# Patient Record
Sex: Female | Born: 1960
Health system: Southern US, Community
[De-identification: ages and names within clinical notes are randomized; demographics above are authoritative.]

## PROBLEM LIST (undated history)

## (undated) DIAGNOSIS — N7011 Chronic salpingitis: Secondary | ICD-10-CM

## (undated) DIAGNOSIS — I10 Essential (primary) hypertension: Secondary | ICD-10-CM

## (undated) DIAGNOSIS — N83209 Unspecified ovarian cyst, unspecified side: Secondary | ICD-10-CM

## (undated) DIAGNOSIS — I4891 Unspecified atrial fibrillation: Secondary | ICD-10-CM

## (undated) DIAGNOSIS — E7800 Pure hypercholesterolemia, unspecified: Secondary | ICD-10-CM

## (undated) DIAGNOSIS — G43909 Migraine, unspecified, not intractable, without status migrainosus: Secondary | ICD-10-CM

## (undated) DIAGNOSIS — D219 Benign neoplasm of connective and other soft tissue, unspecified: Secondary | ICD-10-CM

## (undated) DIAGNOSIS — G4730 Sleep apnea, unspecified: Secondary | ICD-10-CM

## (undated) DIAGNOSIS — R079 Chest pain, unspecified: Secondary | ICD-10-CM

## (undated) DIAGNOSIS — K219 Gastro-esophageal reflux disease without esophagitis: Secondary | ICD-10-CM

## (undated) HISTORY — DX: Unspecified atrial fibrillation: I48.91

## (undated) HISTORY — PX: OOPHORECTOMY: SHX86

## (undated) HISTORY — PX: OTHER SURGICAL HISTORY: SHX169

## (undated) HISTORY — PX: GANGLION CYST EXCISION: SHX1691

## (undated) HISTORY — PX: KNEE SURGERY: SHX244

## (undated) HISTORY — DX: Benign neoplasm of connective and other soft tissue, unspecified: D21.9

## (undated) HISTORY — DX: Essential (primary) hypertension: I10

## (undated) HISTORY — DX: Chronic salpingitis: N70.11

## (undated) HISTORY — DX: Gastro-esophageal reflux disease without esophagitis: K21.9

## (undated) HISTORY — DX: Chest pain, unspecified: R07.9

## (undated) HISTORY — DX: Migraine, unspecified, not intractable, without status migrainosus: G43.909

## (undated) HISTORY — DX: Sleep apnea, unspecified: G47.30

## (undated) HISTORY — DX: Unspecified ovarian cyst, unspecified side: N83.209

## (undated) HISTORY — PX: MYOMECTOMY: SHX85

## (undated) HISTORY — PX: ABDOMINAL SURGERY: SHX537

## (undated) HISTORY — DX: Pure hypercholesterolemia, unspecified: E78.00

## (undated) HISTORY — PX: SALPINGECTOMY: SHX328

## (undated) HISTORY — PX: KENALOG INJECTION: SHX5298

---
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
## 1990-11-25 HISTORY — PX: VAGINAL HYSTERECTOMY: SUR661

## 1998-07-13 ENCOUNTER — Ambulatory Visit (HOSPITAL_COMMUNITY): Admission: RE | Admit: 1998-07-13 | Discharge: 1998-07-13 | Payer: Self-pay | Admitting: Obstetrics and Gynecology

## 1998-07-24 ENCOUNTER — Inpatient Hospital Stay (HOSPITAL_COMMUNITY): Admission: RE | Admit: 1998-07-24 | Discharge: 1998-07-27 | Payer: Self-pay | Admitting: Obstetrics and Gynecology

## 1999-03-23 ENCOUNTER — Emergency Department (HOSPITAL_COMMUNITY): Admission: EM | Admit: 1999-03-23 | Discharge: 1999-03-23 | Payer: Self-pay | Admitting: Emergency Medicine

## 1999-03-23 ENCOUNTER — Encounter: Payer: Self-pay | Admitting: Emergency Medicine

## 1999-03-30 ENCOUNTER — Ambulatory Visit (HOSPITAL_COMMUNITY): Admission: RE | Admit: 1999-03-30 | Discharge: 1999-03-30 | Payer: Self-pay

## 1999-05-07 ENCOUNTER — Other Ambulatory Visit: Admission: RE | Admit: 1999-05-07 | Discharge: 1999-05-07 | Payer: Self-pay | Admitting: Obstetrics and Gynecology

## 1999-09-20 ENCOUNTER — Encounter: Admission: RE | Admit: 1999-09-20 | Discharge: 1999-09-20 | Payer: Self-pay | Admitting: Family Medicine

## 1999-09-20 ENCOUNTER — Encounter: Payer: Self-pay | Admitting: Family Medicine

## 2000-05-07 ENCOUNTER — Ambulatory Visit (HOSPITAL_COMMUNITY): Admission: RE | Admit: 2000-05-07 | Discharge: 2000-05-07 | Payer: Self-pay | Admitting: Obstetrics and Gynecology

## 2000-05-07 ENCOUNTER — Encounter: Payer: Self-pay | Admitting: Obstetrics and Gynecology

## 2000-05-16 ENCOUNTER — Encounter: Payer: Self-pay | Admitting: Obstetrics and Gynecology

## 2000-05-16 ENCOUNTER — Encounter: Admission: RE | Admit: 2000-05-16 | Discharge: 2000-05-16 | Payer: Self-pay | Admitting: Obstetrics and Gynecology

## 2000-05-21 ENCOUNTER — Other Ambulatory Visit: Admission: RE | Admit: 2000-05-21 | Discharge: 2000-05-21 | Payer: Self-pay | Admitting: Obstetrics and Gynecology

## 2001-02-10 ENCOUNTER — Encounter: Payer: Self-pay | Admitting: Gastroenterology

## 2001-02-10 ENCOUNTER — Encounter: Admission: RE | Admit: 2001-02-10 | Discharge: 2001-02-10 | Payer: Self-pay | Admitting: Gastroenterology

## 2001-05-15 ENCOUNTER — Encounter: Payer: Self-pay | Admitting: Obstetrics and Gynecology

## 2001-05-15 ENCOUNTER — Ambulatory Visit (HOSPITAL_COMMUNITY): Admission: RE | Admit: 2001-05-15 | Discharge: 2001-05-15 | Payer: Self-pay | Admitting: Obstetrics and Gynecology

## 2001-05-20 ENCOUNTER — Encounter: Payer: Self-pay | Admitting: Obstetrics and Gynecology

## 2001-05-20 ENCOUNTER — Encounter: Admission: RE | Admit: 2001-05-20 | Discharge: 2001-05-20 | Payer: Self-pay | Admitting: Obstetrics and Gynecology

## 2001-05-21 ENCOUNTER — Other Ambulatory Visit: Admission: RE | Admit: 2001-05-21 | Discharge: 2001-05-21 | Payer: Self-pay | Admitting: Obstetrics and Gynecology

## 2001-06-09 ENCOUNTER — Encounter: Payer: Self-pay | Admitting: Family Medicine

## 2001-06-09 ENCOUNTER — Encounter: Admission: RE | Admit: 2001-06-09 | Discharge: 2001-06-09 | Payer: Self-pay | Admitting: Family Medicine

## 2002-12-31 ENCOUNTER — Encounter: Payer: Self-pay | Admitting: Obstetrics and Gynecology

## 2002-12-31 ENCOUNTER — Encounter: Admission: RE | Admit: 2002-12-31 | Discharge: 2002-12-31 | Payer: Self-pay | Admitting: Obstetrics and Gynecology

## 2003-01-06 ENCOUNTER — Other Ambulatory Visit: Admission: RE | Admit: 2003-01-06 | Discharge: 2003-01-06 | Payer: Self-pay | Admitting: Obstetrics and Gynecology

## 2003-02-16 ENCOUNTER — Encounter: Admission: RE | Admit: 2003-02-16 | Discharge: 2003-02-16 | Payer: Self-pay | Admitting: Family Medicine

## 2003-02-16 ENCOUNTER — Encounter: Payer: Self-pay | Admitting: Family Medicine

## 2003-08-17 ENCOUNTER — Encounter: Payer: Self-pay | Admitting: Family Medicine

## 2003-08-17 ENCOUNTER — Ambulatory Visit (HOSPITAL_COMMUNITY): Admission: RE | Admit: 2003-08-17 | Discharge: 2003-08-17 | Payer: Self-pay | Admitting: Family Medicine

## 2004-01-19 ENCOUNTER — Encounter: Admission: RE | Admit: 2004-01-19 | Discharge: 2004-01-19 | Payer: Self-pay | Admitting: Obstetrics and Gynecology

## 2004-01-25 ENCOUNTER — Ambulatory Visit (HOSPITAL_COMMUNITY): Admission: RE | Admit: 2004-01-25 | Discharge: 2004-01-25 | Payer: Self-pay | Admitting: Family Medicine

## 2004-01-26 ENCOUNTER — Other Ambulatory Visit: Admission: RE | Admit: 2004-01-26 | Discharge: 2004-01-26 | Payer: Self-pay | Admitting: Obstetrics and Gynecology

## 2004-10-30 ENCOUNTER — Ambulatory Visit (HOSPITAL_COMMUNITY): Admission: RE | Admit: 2004-10-30 | Discharge: 2004-10-30 | Payer: Self-pay | Admitting: Gastroenterology

## 2005-09-10 ENCOUNTER — Encounter: Admission: RE | Admit: 2005-09-10 | Discharge: 2005-09-10 | Payer: Self-pay | Admitting: Family Medicine

## 2005-09-17 ENCOUNTER — Other Ambulatory Visit: Admission: RE | Admit: 2005-09-17 | Discharge: 2005-09-17 | Payer: Self-pay | Admitting: Obstetrics and Gynecology

## 2006-05-02 ENCOUNTER — Encounter: Admission: RE | Admit: 2006-05-02 | Discharge: 2006-05-02 | Payer: Self-pay | Admitting: Family Medicine

## 2006-05-07 ENCOUNTER — Encounter: Admission: RE | Admit: 2006-05-07 | Discharge: 2006-05-07 | Payer: Self-pay | Admitting: Family Medicine

## 2007-01-13 ENCOUNTER — Emergency Department (HOSPITAL_COMMUNITY): Admission: EM | Admit: 2007-01-13 | Discharge: 2007-01-13 | Payer: Self-pay | Admitting: Emergency Medicine

## 2007-01-20 ENCOUNTER — Other Ambulatory Visit: Admission: RE | Admit: 2007-01-20 | Discharge: 2007-01-20 | Payer: Self-pay | Admitting: Obstetrics and Gynecology

## 2007-01-21 ENCOUNTER — Encounter: Admission: RE | Admit: 2007-01-21 | Discharge: 2007-01-21 | Payer: Self-pay | Admitting: Obstetrics and Gynecology

## 2007-11-12 ENCOUNTER — Encounter: Admission: RE | Admit: 2007-11-12 | Discharge: 2007-11-13 | Payer: Self-pay | Admitting: Family Medicine

## 2008-03-17 ENCOUNTER — Encounter: Admission: RE | Admit: 2008-03-17 | Discharge: 2008-03-17 | Payer: Self-pay | Admitting: Obstetrics and Gynecology

## 2008-04-15 ENCOUNTER — Other Ambulatory Visit: Admission: RE | Admit: 2008-04-15 | Discharge: 2008-04-15 | Payer: Self-pay | Admitting: Obstetrics and Gynecology

## 2009-03-13 ENCOUNTER — Encounter
Admission: RE | Admit: 2009-03-13 | Discharge: 2009-03-13 | Payer: Self-pay | Admitting: Physical Medicine and Rehabilitation

## 2009-08-23 ENCOUNTER — Encounter: Admission: RE | Admit: 2009-08-23 | Discharge: 2009-08-23 | Payer: Self-pay | Admitting: Obstetrics and Gynecology

## 2009-08-26 ENCOUNTER — Encounter: Payer: Self-pay | Admitting: Internal Medicine

## 2009-08-27 ENCOUNTER — Encounter: Payer: Self-pay | Admitting: Internal Medicine

## 2009-09-04 ENCOUNTER — Encounter: Payer: Self-pay | Admitting: Internal Medicine

## 2009-09-13 ENCOUNTER — Other Ambulatory Visit: Admission: RE | Admit: 2009-09-13 | Discharge: 2009-09-13 | Payer: Self-pay | Admitting: Obstetrics and Gynecology

## 2009-09-13 ENCOUNTER — Encounter: Payer: Self-pay | Admitting: Obstetrics and Gynecology

## 2009-09-13 ENCOUNTER — Ambulatory Visit: Payer: Self-pay | Admitting: Obstetrics and Gynecology

## 2010-01-22 ENCOUNTER — Ambulatory Visit: Payer: Self-pay | Admitting: Internal Medicine

## 2010-01-22 DIAGNOSIS — I10 Essential (primary) hypertension: Secondary | ICD-10-CM | POA: Insufficient documentation

## 2010-01-22 DIAGNOSIS — K219 Gastro-esophageal reflux disease without esophagitis: Secondary | ICD-10-CM | POA: Insufficient documentation

## 2010-01-22 DIAGNOSIS — I480 Paroxysmal atrial fibrillation: Secondary | ICD-10-CM | POA: Insufficient documentation

## 2010-01-22 DIAGNOSIS — I4891 Unspecified atrial fibrillation: Secondary | ICD-10-CM

## 2010-02-12 ENCOUNTER — Encounter: Payer: Self-pay | Admitting: Internal Medicine

## 2010-06-11 ENCOUNTER — Ambulatory Visit: Payer: Self-pay | Admitting: Internal Medicine

## 2010-12-16 ENCOUNTER — Encounter: Payer: Self-pay | Admitting: Obstetrics and Gynecology

## 2010-12-25 NOTE — Assessment & Plan Note (Signed)
Summary: per check out/sf   Primary Provider:  Felipa Eth, MD   History of Present Illness: Mary Sharp returns  today for followup of atrial fibrillation.  The patient is a very pleasant morbidly obese woman with a h/o a single episode of atrial fibrillation which was diagnosed back in 10/10.  At that time she was hospitalized with about 5 hrs of atrial fibrillation which ultimately terminated.  Since then,  she has had no significant recurrent symptoms.  She notes that she will experience the sensation that her skips and flutters for seconds at a time but has otherwise been stable.   She has lost some weight though not as much as she would have liked.   Problems Prior to Update: 1)  Morbid Obesity  (ICD-278.01) 2)  Essential Hypertension, Benign  (ICD-401.1) 3)  Atrial Fibrillation  (ICD-427.31) 4)  Gerd  (ICD-530.81)  Current Medications (verified): 1)  Pravastatin Sodium 40 Mg Tabs (Pravastatin Sodium) .... Take One Tablet By Mouth Daily At Bedtime 2)  Nexium 40 Mg Cpdr (Esomeprazole Magnesium) .Marland Kitchen.. 1 Tablet Two Times A Day 3)  Metformin Hcl 500 Mg Tabs (Metformin Hcl) .... 2 Tablets Two Times A Day 4)  Glipizide 5 Mg Tabs (Glipizide) .... Take One Tablet By Mouth Twice Daily. 5)  Losartan Potassium 100 Mg Tabs (Losartan Potassium) .Marland Kitchen.. 1 Tab Two Times A Day 6)  Amlodipine Besylate 10 Mg Tabs (Amlodipine Besylate) .... Take One Tablet By Mouth Daily 7)  Welchol 625 Mg Tabs (Colesevelam Hcl) .... 3 Tabs Two Times A Day 8)  Aspirin Ec 325 Mg Tbec (Aspirin) .... Take One Tablet By Mouth Daily 9)  Metoprolol Tartrate 50 Mg Tabs (Metoprolol Tartrate) .... Take One Tablet By Mouth Twice A Day  Allergies (verified): 1)  ! Morphine 2)  ! Codeine 3)  ! Darvocet 4)  ! Vicodin  Past History:  Past Medical History: Last updated: 01/22/2010 Current Problems:  ATRIAL FIBRILLATION (ICD-427.31) GERD (ICD-530.81)  DM HTN Dyslipidemia  Review of Systems  The patient denies chest pain,  syncope, dyspnea on exertion, and peripheral edema.    Vital Signs:  Patient profile:   50 year old female Height:      65 inches Weight:      262 pounds BMI:     43.76 Pulse rate:   58 / minute BP sitting:   140 / 80  (left arm)  Vitals Entered By: Laurance Flatten CMA (June 11, 2010 9:31 AM)  Physical Exam  General:  Obese, well developed, well nourished, in no acute distress.  HEENT: normal Neck: supple. No JVD. Carotids 2+ bilaterally no bruits Cor: RRR no rubs, gallops or murmur Lungs: CTA Ab: soft, nontender. nondistended. No HSM. Good bowel sounds. Obese. Ext: warm. no cyanosis, clubbing or edema Neuro: alert and oriented. Grossly nonfocal. affect pleasant    EKG  Procedure date:  06/11/2010  Findings:      Sinus bradycardia with rate of:  58.  Impression & Recommendations:  Problem # 1:  ATRIAL FIBRILLATION (ICD-427.31) She has had no additional symptoms.  I have discussed anti-coagulation and will continue ASA for now.  If she has additional symptoms then we will require coumadin or pradaxa . Her updated medication list for this problem includes:    Aspirin Ec 325 Mg Tbec (Aspirin) .Marland Kitchen... Take one tablet by mouth daily    Metoprolol Tartrate 50 Mg Tabs (Metoprolol tartrate) .Marland Kitchen... Take one tablet by mouth twice a day  Orders: EKG w/ Interpretation (93000)  Problem #  2:  MORBID OBESITY (ICD-278.01) I spent a significant talking discussing the importance of weight loss including exercise and diet counseling.  She could make both her DM and HTN go away with weight loss.  She will continue to try.  Problem # 3:  ESSENTIAL HYPERTENSION, BENIGN (ICD-401.1) In addition to meds below, a low sodium diet is requested. Her updated medication list for this problem includes:    Losartan Potassium 100 Mg Tabs (Losartan potassium) .Marland Kitchen... 1 tab two times a day    Amlodipine Besylate 10 Mg Tabs (Amlodipine besylate) .Marland Kitchen... Take one tablet by mouth daily    Aspirin Ec 325 Mg Tbec  (Aspirin) .Marland Kitchen... Take one tablet by mouth daily    Metoprolol Tartrate 50 Mg Tabs (Metoprolol tartrate) .Marland Kitchen... Take one tablet by mouth twice a day  Patient Instructions: 1)  Your physician recommends that you schedule a follow-up appointment in: 6 months with Dr Ladona Ridgel

## 2010-12-25 NOTE — Letter (Signed)
Summary: [facility] Medical Clearance   [facility] Medical Clearance   Imported By: Roderic Ovens 03/06/2010 15:54:27  _____________________________________________________________________  External Attachment:    Type:   Image     Comment:   External Document

## 2010-12-25 NOTE — Assessment & Plan Note (Signed)
Summary: nep/ new onset of afib as of oct/ pt has uch/ gd   Visit Type:  Initial Consult Primary Provider:  Felipa Eth, MD   History of Present Illness: Mary Sharp is referred today by Dr. Felipa Eth for evaluation of atrial fibrillation.  The patient is a very pleasant morbidly obese woman with a h/o a single episode of atrial fibrillation which was diagnosed back in 10/10.  At that time she was hospitalized with about 5 hrs of atrial fibrillation which ultimately terminated.  Since then, and prior to that, she has had no significant recurrent symptoms.  She notes that she will experience the sensation that her skips and flutters for seconds at a time but has otherwise been stable.  The patient has never had syncope. She was diagnosed with diabetes and HTN about 5 yrs. ago.  Current Medications (verified): 1)  Pravastatin Sodium 40 Mg Tabs (Pravastatin Sodium) .... Take One Tablet By Mouth Daily At Bedtime 2)  Nexium 40 Mg Cpdr (Esomeprazole Magnesium) .Marland Kitchen.. 1 Tablet Two Times A Day 3)  Metformin Hcl 500 Mg Tabs (Metformin Hcl) .... 2 Tablets Two Times A Day 4)  Glipizide 5 Mg Tabs (Glipizide) .... Take One Tablet By Mouth Twice Daily. 5)  Losartan Potassium 100 Mg Tabs (Losartan Potassium) .... Take One Tablet By Mouth Once Daily. 6)  Amlodipine Besylate 10 Mg Tabs (Amlodipine Besylate) .... Take One Tablet By Mouth Daily 7)  Welchol 625 Mg Tabs (Colesevelam Hcl) .... 3 Tabs Two Times A Day 8)  Aspirin 81 Mg Tbec (Aspirin) .... Take One Tablet By Mouth Daily  Allergies (verified): 1)  ! Morphine 2)  ! Codeine 3)  ! Darvocet 4)  ! Vicodin  Past History:  Past Medical History: Current Problems:  ATRIAL FIBRILLATION (ICD-427.31) GERD (ICD-530.81)  DM HTN Dyslipidemia  Family History: Mother is alive and well Father died of an MI at 36 2 brothers one with CAD  Social History: Married 3 children No tobacco since 1996 rare caffiene.  Review of Systems       All systems reviewed and  negative except as noted in the HPI.  Vital Signs:  Patient profile:   50 year old female Height:      65 inches Weight:      268 pounds BMI:     44.76 Pulse rate:   60 / minute BP sitting:   132 / 80  (left arm)  Vitals Entered By: Laurance Flatten CMA (January 22, 2010 1:52 PM)  Physical Exam  General:  Obese, well developed, well nourished, in no acute distress.  HEENT: normal Neck: supple. No JVD. Carotids 2+ bilaterally no bruits Cor: RRR no rubs, gallops or murmur Lungs: CTA Ab: soft, nontender. nondistended. No HSM. Good bowel sounds. Obese. Ext: warm. no cyanosis, clubbing or edema Neuro: alert and oriented. Grossly nonfocal. affect pleasant    EKG  Procedure date:  01/22/2010  Findings:      Normal sinus rhythm with rate of:    EKG  Procedure date:  01/22/2010  Findings:      Normal sinus rhythm with rate of:  60  Impression & Recommendations:  Problem # 1:  ATRIAL FIBRILLATION (ICD-427.31) The patient has had a single episode of atrial fibrillation.  She is quite sensitive to any palpitations and notes that she has not had other symptoms.  I have discussed the treatment options with the patient.  She does not have enough symptoms to recommend any additional cardiac medications.  She has a  CHADS score of 2.  The central question is whether a single brief episode of atrial fibrillation justifies life long coumadin.  For now, I have asked her to switch to full strength ASA.  If she has more symptomatic atrial fib, then I would suggest starting coumadin.  Alternatively, she has obesity related DM and HTN and significant weight loss might result in both DM and HTN resolving which would lower her [place] score.  I have strongly encouraged exercise and dieting on this regard. Her updated medication list for this problem includes:    Aspirin 81 Mg Tbec (Aspirin) .Marland Kitchen... Take one tablet by mouth daily  Problem # 2:  ESSENTIAL HYPERTENSION, BENIGN (ICD-401.1) She will continue  her current meds except that I have asked that she increase her dose of  ASA. Her updated medication list for this problem includes:    Losartan Potassium 100 Mg Tabs (Losartan potassium) .Marland Kitchen... Take one tablet by mouth once daily.    Amlodipine Besylate 10 Mg Tabs (Amlodipine besylate) .Marland Kitchen... Take one tablet by mouth daily    Aspirin 81 Mg Tbec (Aspirin) .Marland Kitchen... Take one tablet by mouth daily  Problem # 3:  MORBID OBESITY (ICD-278.01) I have strongly encouraged diet and exercise to lose weight.  Alternatively, I think she would be candidate for bariatric surgery.  Patient Instructions: 1)  Your physician recommends that you schedule a follow-up appointment in: 3-4 months with Dr Ladona Ridgel

## 2010-12-25 NOTE — Letter (Signed)
Summary: [facility]   Imported By: Roderic Ovens 02/14/2010 13:11:53  _____________________________________________________________________  External Attachment:    Type:   Image     Comment:   External Document

## 2010-12-25 NOTE — Letter (Signed)
Summary: [facility]   Imported By: Roderic Ovens 02/14/2010 13:09:08  _____________________________________________________________________  External Attachment:    Type:   Image     Comment:   External Document

## 2010-12-25 NOTE — Letter (Signed)
Summary: [facility] Office Note  [facility] Office Note   Imported By: Roderic Ovens 02/14/2010 13:29:40  _____________________________________________________________________  External Attachment:    Type:   Image     Comment:   External Document

## 2011-01-31 ENCOUNTER — Other Ambulatory Visit: Payer: Self-pay | Admitting: Obstetrics and Gynecology

## 2011-01-31 DIAGNOSIS — Z1231 Encounter for screening mammogram for malignant neoplasm of breast: Secondary | ICD-10-CM

## 2011-02-07 ENCOUNTER — Ambulatory Visit
Admission: RE | Admit: 2011-02-07 | Discharge: 2011-02-07 | Disposition: A | Discharge status: Home / Self Care | Payer: 59 | Source: Ambulatory Visit | Attending: Obstetrics and Gynecology | Admitting: Obstetrics and Gynecology

## 2011-02-07 DIAGNOSIS — Z1231 Encounter for screening mammogram for malignant neoplasm of breast: Secondary | ICD-10-CM

## 2011-02-18 ENCOUNTER — Other Ambulatory Visit: Payer: Self-pay | Admitting: Obstetrics and Gynecology

## 2011-02-18 ENCOUNTER — Other Ambulatory Visit (HOSPITAL_COMMUNITY)
Admission: RE | Admit: 2011-02-18 | Discharge: 2011-02-18 | Disposition: A | Discharge status: Home / Self Care | Payer: 59 | Source: Ambulatory Visit | Attending: Obstetrics and Gynecology | Admitting: Obstetrics and Gynecology

## 2011-02-18 ENCOUNTER — Encounter (INDEPENDENT_AMBULATORY_CARE_PROVIDER_SITE_OTHER): Payer: 59 | Admitting: Obstetrics and Gynecology

## 2011-02-18 DIAGNOSIS — Z124 Encounter for screening for malignant neoplasm of cervix: Secondary | ICD-10-CM | POA: Insufficient documentation

## 2011-02-18 DIAGNOSIS — Z01419 Encounter for gynecological examination (general) (routine) without abnormal findings: Secondary | ICD-10-CM

## 2011-02-26 ENCOUNTER — Encounter (INDEPENDENT_AMBULATORY_CARE_PROVIDER_SITE_OTHER): Payer: 59

## 2011-02-26 DIAGNOSIS — Z13820 Encounter for screening for osteoporosis: Secondary | ICD-10-CM

## 2011-04-12 NOTE — Op Note (Signed)
Mary Sharp, Mary Sharp                 ACCOUNT NO.:  [id number]   MEDICAL RECORD NO.:  [id number]          PATIENT TYPE:  AMB   LOCATION:  ENDO                         FACILITY:  [facility]   PHYSICIAN:  James L. Malon Kindle., M.D.DATE OF BIRTH:  1961-07-08   DATE OF PROCEDURE:  10/30/2004  DATE OF DISCHARGE:                                 OPERATIVE REPORT   PROCEDURE:  Esophageal manometry.   ENDOSCOPIST:  Llana Aliment. Randa Evens, M.D.   INDICATIONS FOR PROCEDURE:  Persistent esophageal reflux symptoms.  This is  being done in anticipation of consideration for an anti-reflux procedure.   DESCRIPTION OF PROCEDURE:  The procedure was performed at the [facility] Manometry [facility] in the usual manner.   RESULTS:  1.  Upper esophageal sphincter:  Relaxes completely with swallowing.  2.  Esophageal body:  Peristalsis is normal in all 10 swallows.  The mean      pressure is 139 mm.  The mean duration is 4.6 seconds in the distal      esophagus, both within the normal limits.  3.  Lower esophageal sphincter:  Mean pressure is 35.7, with complete      relaxation.   ASSESSMENT:  Essentially normal manometry.   PLAN:  Will see the patient back in the office to discuss possible anti-  reflux surgery with her.       JLE/MEDQ  D:  11/25/2004  T:  11/25/2004  Job:  [id number]   cc:   Mosetta Putt, M.D.  [address] [place]  [place] [address]  Fax: [phone]

## 2011-06-26 ENCOUNTER — Other Ambulatory Visit: Payer: Self-pay | Admitting: Dermatology

## 2012-01-30 ENCOUNTER — Other Ambulatory Visit: Payer: Self-pay | Admitting: Obstetrics and Gynecology

## 2012-01-30 DIAGNOSIS — Z1231 Encounter for screening mammogram for malignant neoplasm of breast: Secondary | ICD-10-CM

## 2012-02-11 ENCOUNTER — Ambulatory Visit
Admission: RE | Admit: 2012-02-11 | Discharge: 2012-02-11 | Disposition: A | Discharge status: Home / Self Care | Payer: 59 | Source: Ambulatory Visit | Attending: Obstetrics and Gynecology | Admitting: Obstetrics and Gynecology

## 2012-02-11 DIAGNOSIS — Z1231 Encounter for screening mammogram for malignant neoplasm of breast: Secondary | ICD-10-CM

## 2012-02-14 ENCOUNTER — Encounter: Payer: Self-pay | Admitting: Gynecology

## 2012-02-14 DIAGNOSIS — N7011 Chronic salpingitis: Secondary | ICD-10-CM | POA: Insufficient documentation

## 2012-02-14 DIAGNOSIS — N83209 Unspecified ovarian cyst, unspecified side: Secondary | ICD-10-CM | POA: Insufficient documentation

## 2012-02-14 DIAGNOSIS — E119 Type 2 diabetes mellitus without complications: Secondary | ICD-10-CM | POA: Insufficient documentation

## 2012-02-14 DIAGNOSIS — D219 Benign neoplasm of connective and other soft tissue, unspecified: Secondary | ICD-10-CM | POA: Insufficient documentation

## 2012-02-14 DIAGNOSIS — K219 Gastro-esophageal reflux disease without esophagitis: Secondary | ICD-10-CM | POA: Insufficient documentation

## 2012-02-14 DIAGNOSIS — I10 Essential (primary) hypertension: Secondary | ICD-10-CM | POA: Insufficient documentation

## 2012-02-20 ENCOUNTER — Ambulatory Visit (INDEPENDENT_AMBULATORY_CARE_PROVIDER_SITE_OTHER): Payer: 59 | Admitting: Obstetrics and Gynecology

## 2012-02-20 ENCOUNTER — Encounter: Payer: Self-pay | Admitting: Obstetrics and Gynecology

## 2012-02-20 VITALS — BP 140/80 | Ht 64.0 in | Wt 276.0 lb

## 2012-02-20 DIAGNOSIS — Z01419 Encounter for gynecological examination (general) (routine) without abnormal findings: Secondary | ICD-10-CM

## 2012-02-20 LAB — URINALYSIS W MICROSCOPIC + REFLEX CULTURE
Glucose, UA: NEGATIVE mg/dL
Hgb urine dipstick: NEGATIVE
Leukocytes, UA: NEGATIVE
Nitrite: NEGATIVE
Protein, ur: NEGATIVE mg/dL
Specific Gravity, Urine: >1.03 — ABNORMAL HIGH (ref 1.005–1.030)
Urobilinogen, UA: 0.2 mg/dL (ref 0.0–1.0)
pH: 5 (ref 5.0–8.0)

## 2012-02-20 NOTE — Progress Notes (Signed)
Patient came to see me today for her annual GYN exam. She is menopausal. She has mild menopausal symptoms. She does not require HRT. She just had a normal mammogram. She had a normal bone density last year. She is not sexually active due to her husband's prostate cancer. She is status post vaginal hysterectomy, left salpingo-oophorectomy, right salpingectomy done for fibroids, bleeding, pain, and right hydrosalpinx. She is having no more pain. She is having no vaginal bleeding. She just had lab work done by her PCP.  HEENT: Within normal limits.  Kennon Portela present. Neck: No masses. Supraclavicular lymph nodes: Not enlarged. Breasts: Examined in both sitting and lying position. Symmetrical without skin changes or masses. Abdomen: Soft no masses guarding or rebound. No hernias. Pelvic: External within normal limits. BUS within normal limits. Vaginal examination shows good estrogen effect, no cystocele enterocele or rectocele. Cervix and uterus absent. Adnexa within normal limits. Rectovaginal confirmatory. Extremities within normal limits.  Assessment: Normal GYN exam  Plan: Continue yearly mammograms.

## 2012-08-20 HISTORY — PX: KNEE ARTHROSCOPY: SUR90

## 2012-09-07 ENCOUNTER — Ambulatory Visit: Payer: 59 | Attending: Orthopaedic Surgery | Admitting: Rehabilitation

## 2012-09-07 DIAGNOSIS — M25569 Pain in unspecified knee: Secondary | ICD-10-CM | POA: Insufficient documentation

## 2012-09-07 DIAGNOSIS — IMO0001 Reserved for inherently not codable concepts without codable children: Secondary | ICD-10-CM | POA: Insufficient documentation

## 2012-09-09 ENCOUNTER — Ambulatory Visit: Payer: 59 | Admitting: Rehabilitation

## 2012-09-14 ENCOUNTER — Ambulatory Visit: Payer: 59

## 2012-09-16 ENCOUNTER — Ambulatory Visit: Payer: 59

## 2012-09-21 ENCOUNTER — Ambulatory Visit: Payer: 59 | Admitting: Rehabilitation

## 2012-09-23 ENCOUNTER — Ambulatory Visit: Payer: 59 | Admitting: Rehabilitation

## 2012-09-28 ENCOUNTER — Ambulatory Visit: Payer: 59 | Attending: Orthopaedic Surgery | Admitting: Rehabilitation

## 2012-09-28 DIAGNOSIS — IMO0001 Reserved for inherently not codable concepts without codable children: Secondary | ICD-10-CM | POA: Insufficient documentation

## 2012-09-28 DIAGNOSIS — M25569 Pain in unspecified knee: Secondary | ICD-10-CM | POA: Insufficient documentation

## 2012-09-30 ENCOUNTER — Ambulatory Visit: Payer: 59 | Admitting: Rehabilitation

## 2012-10-05 ENCOUNTER — Ambulatory Visit: Payer: 59 | Admitting: Rehabilitation

## 2012-10-07 ENCOUNTER — Ambulatory Visit: Payer: 59 | Admitting: Rehabilitation

## 2012-10-12 ENCOUNTER — Ambulatory Visit: Payer: 59 | Admitting: Rehabilitation

## 2012-10-14 ENCOUNTER — Ambulatory Visit: Payer: 59 | Admitting: Rehabilitation

## 2012-10-19 ENCOUNTER — Ambulatory Visit: Payer: 59 | Admitting: Rehabilitation

## 2012-10-21 ENCOUNTER — Ambulatory Visit: Payer: 59 | Admitting: Rehabilitation

## 2013-02-19 ENCOUNTER — Other Ambulatory Visit: Payer: Self-pay | Admitting: *Deleted

## 2013-02-19 ENCOUNTER — Ambulatory Visit (INDEPENDENT_AMBULATORY_CARE_PROVIDER_SITE_OTHER): Payer: 59

## 2013-02-19 DIAGNOSIS — G4710 Hypersomnia, unspecified: Secondary | ICD-10-CM

## 2013-02-19 DIAGNOSIS — R0989 Other specified symptoms and signs involving the circulatory and respiratory systems: Secondary | ICD-10-CM

## 2013-02-19 DIAGNOSIS — G4730 Sleep apnea, unspecified: Secondary | ICD-10-CM

## 2013-02-19 DIAGNOSIS — R0681 Apnea, not elsewhere classified: Secondary | ICD-10-CM

## 2013-02-22 ENCOUNTER — Other Ambulatory Visit: Payer: Self-pay | Admitting: *Deleted

## 2013-02-22 DIAGNOSIS — G4730 Sleep apnea, unspecified: Secondary | ICD-10-CM

## 2013-02-22 DIAGNOSIS — G4710 Hypersomnia, unspecified: Secondary | ICD-10-CM

## 2013-02-22 DIAGNOSIS — R0683 Snoring: Secondary | ICD-10-CM

## 2013-03-05 ENCOUNTER — Other Ambulatory Visit: Payer: Self-pay | Admitting: *Deleted

## 2013-03-05 DIAGNOSIS — G4710 Hypersomnia, unspecified: Secondary | ICD-10-CM

## 2013-03-11 ENCOUNTER — Other Ambulatory Visit: Payer: Self-pay | Admitting: *Deleted

## 2013-03-11 DIAGNOSIS — G4733 Obstructive sleep apnea (adult) (pediatric): Secondary | ICD-10-CM

## 2013-03-15 ENCOUNTER — Telehealth: Payer: Self-pay | Admitting: Neurology

## 2013-03-15 NOTE — Telephone Encounter (Signed)
Called the patient to discuss sleep study results and recommendations for cpap use.  Left a message for her to return a call to the office and mailed a copy of sleep study report to the home address with additional instruction...kl

## 2013-04-22 ENCOUNTER — Encounter (INDEPENDENT_AMBULATORY_CARE_PROVIDER_SITE_OTHER): Payer: 59 | Admitting: *Deleted

## 2013-04-22 DIAGNOSIS — R609 Edema, unspecified: Secondary | ICD-10-CM

## 2013-04-26 ENCOUNTER — Encounter: Payer: Self-pay | Admitting: Internal Medicine

## 2013-05-05 ENCOUNTER — Ambulatory Visit (INDEPENDENT_AMBULATORY_CARE_PROVIDER_SITE_OTHER): Payer: 59 | Admitting: Neurology

## 2013-05-05 ENCOUNTER — Encounter: Payer: Self-pay | Admitting: Neurology

## 2013-05-05 VITALS — BP 145/78 | HR 73 | Ht 64.0 in | Wt 275.0 lb

## 2013-05-05 DIAGNOSIS — Z6841 Body Mass Index (BMI) 40.0 and over, adult: Secondary | ICD-10-CM

## 2013-05-05 DIAGNOSIS — G4733 Obstructive sleep apnea (adult) (pediatric): Secondary | ICD-10-CM

## 2013-05-05 DIAGNOSIS — G4730 Sleep apnea, unspecified: Secondary | ICD-10-CM

## 2013-05-05 NOTE — Assessment & Plan Note (Signed)
Compliance visit 05-05-13 .

## 2013-05-05 NOTE — Progress Notes (Signed)
[facility]  Provider:  Dr Vickey Huger Referring Provider: Hoyle Sauer, MD Primary Care Physician:  Hoyle Sauer, MD  Chief Complaint  Patient presents with  . New Evaluation    sleep study, 30 day,CPAP, rm 11    HPI:  Mary Sharp is a 52 y.o. female here as a referral from Dr. Felipa Eth for follow up from splits study, dated  02-19-13 and interpreted on April 9th 2014 by Dr Frances Furbish in my absence.  .  The patient is a 34, right-handed, Caucasian female with a past medical history of diabetes, morbid obesity, hypertension, atrial fibrillation, hyperlipidemia and GERD. She presented with complaints of excessive daytime sleepiness and witnessed apneas frequent awakenings snoring shortness of breath when sleeping supine, recurrent nocturia , and felt that her sleep is nonrestorative.  The patient reports going to the bathroom 4-5 times a night, having gained weight to a BMI of 46.3, and her husband snored loudly and interrupted her sleep for other. Her bedtime is 9 PM  at she rises at about 4:45 AM in week, and on weekends may sleep until 6:30 AM she drinks caffeinated beverages in the morning to get started. 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 As the patient went through menopause she had more problems stool fall asleep and stay asleep and to get restorative sleep. He has no childhood or young adult onset sleep disorders that she knows of. There is no family history of a sleep disorder to her knowledge.  Since being on CPAP at 10 cm water she changed form nasal pillow to a nasal mask and  Has been having only 1-2 bathroom breaks at night. However at the same time she noted swelling in her lower extremities and skin changes associated with that. Dr. Felipa Eth obtained  lower extremity vascular Doppler studies, which returned negative for blockage. 2 no longer has a dry mouth in the morning, that is normal on in headaches. Over all her night is less  interrupted. I reviewed the pre-CPAP at Epworth score which was endorsed at 11 points the back inventory at 12 points pulse CPAP at score is still 12 points at the patient reports feeling better more alert and she may not have been able to a certain hole sleepy she truly was before treatment. The back score is 3 points.  A detailed download was obtained in the office today : Compliance- data from 03/29/2013 through 05/04/2013(  03/29/2013 was the first day her machine was issued).  It is set at 10 cm water with up to a centimeter EPR, residual AHI is 0.7, average daily usage of 6 hours and 32 minutes. The patient's subjective feeling is that she sleeps for the time she uses the machine she's not even aware of the pressure. She had change the mask and had eliminated the RAMP time, but still states that she can barely feel the machine blowing. He has noticed a decrease in her blood glucose levels and had improvement in her HbA1c was noted by Dr. Felipa Eth.   The patient remains morbidly obese and is aware of this being her main OSA risk factor.  Atrial fibrillation is medication controlled. No palpitations since 08-2009. Bronchitis in Februar 2014 , no recurrence.   In September 2013 the patient underwent a left knee arthroscopy and was told that her knee had deteriorated to a greater degree than is fluent and. Ulcers the surgery went well she is still using a quad-cane to walk.  Review of Systems: Out of a complete 14 system review, the patient complains of only the following symptoms, and all other reviewed systems are negative. Epworth 12 , 11 pre study, better sleep ,becks 13 , and FSS 31. Nocturia resolved.   History   Social History  . Marital Status: Married    Spouse Name: N/A    Number of Children: 2  . Years of Education: 12   Occupational History  .  [employer]   Social History Main Topics  . Smoking status: Former Smoker    Quit date: 11/25/1995  . Smokeless  tobacco: Not on file  . Alcohol Use: No  . Drug Use: No  . Sexually Active: No   Other Topics Concern  . Not on file   Social History Narrative  . No narrative on file    Family History  Problem Relation Age of Onset  . Breast cancer Mother     Age 49  . Heart disease Brother   . Coronary artery disease Brother     stent placement  . Hypertension Brother   . Crohn's disease Brother     Past Medical History  Diagnosis Date  . Fibroid   . Ovarian cyst     Dermoid-Right   Serous cystadenoma-Left  . Hydrosalpinx     Right  . Acid reflux   . Diabetes mellitus   . Hypertension   . Elevated cholesterol   . Atrial fibrillation   . Migraine   . Sleep apnea with use of continuous positive airway pressure (CPAP)      02-19-13 , AHI 59.8 , titration to 10 cm water.     Past Surgical History  Procedure Laterality Date  . Vaginal hysterectomy  1992    partial  . Myomectomy      Multiple  . Abdominal surgery      Expl. Laparotomy  . Salpingectomy      Right  . Knee surgery Right 2004-2005  . Oophorectomy      LSO  . Knee arthroscopy Left 08/20/12    torn meniscus  . Kenalog injection    . Ganglion cyst excision Right [id number]    wrist  . Myomectomy  [id number]  . Tonsillectomy  1965  . Knee surgery, arthroscopic       sept 2013 , left knee     Current Outpatient Prescriptions  Medication Sig Dispense Refill  . amLODipine (NORVASC) 10 MG tablet Take 10 mg by mouth daily. Pm dosage      . aspirin 325 MG tablet Take 325 mg by mouth daily. Am dosage      . Colesevelam HCl (WELCHOL PO) Take 18-75 mg by mouth.       . esomeprazole (NEXIUM) 40 MG capsule Take 40 mg by mouth 2 (two) times daily.       Marland Kitchen Fexofenadine HCl (ALLEGRA PO) Take by mouth. One tablet daily during allergy season      . glipiZIDE (GLUCOTROL) 10 MG tablet Take 10 mg by mouth 2 (two) times daily before a meal.      . ibuprofen (ADVIL,MOTRIN) 800 MG tablet Take 800 mg by mouth daily as needed for  pain. 4-200mg  tablets am  As needed for pain      . losartan (COZAAR) 50 MG tablet Take 50 mg by mouth daily. 1/2 tablet am, 1/2 tablet pm      . metFORMIN (GLUCOPHAGE) 1000 MG tablet Take 1,000 mg by mouth 2 (two) times daily  with a meal.      . METOPROLOL TARTRATE PO Take 50 mg by mouth. One tablet 2 times daily      . Multiple Vitamin (MULTIVITAMIN) tablet Take 1 tablet by mouth daily.      . pravastatin (PRAVACHOL) 40 MG tablet Take 40 mg by mouth daily.      Letta Pate DELICA LANCETS 33G MISC        No current facility-administered medications for this visit.    Allergies as of 05/05/2013 - Review Complete 02/20/2012  Allergen Reaction Noted  . Codeine Nausea And Vomiting   . Hydrocodone-acetaminophen Nausea Only   . Propoxyphene-acetaminophen    . Morphine Nausea Only and Rash     Vitals: BP 145/78  Pulse 73  Ht 5\' 4"  (1.626 m)  Wt 275 lb (124.739 kg)  BMI 47.18 kg/m2 Last Weight:  Wt Readings from Last 1 Encounters:  05/05/13 275 lb (124.739 kg)   Last Height:   Ht Readings from Last 1 Encounters:  05/05/13 5\' 4"  (1.626 m)     Physical exam:  General: The patient is awake, alert and appears not in acute distress. The patient is well groomed. Head: Normocephalic, atraumatic.  Neck is supple. Mallampati 2  , neck circumference: 16 inches. Retrognathia. Chin strap has not affected her, nasal mask : no pressure marks.  Tonsilectomy , adenoidectomy at age 72.  Cardiovascular:  Regular rate and rhythm  without  murmurs or carotid bruit, and without distended neck veins. Respiratory: Lungs are clear to auscultation. Skin:  Without evidence of edema, or rash Trunk: BMI 46  elevated and patient  has normal posture.  Neurologic exam : The patient is awake and alert, oriented to place and time.  Memory subjective described as intact. There is a normal attention span & concentration ability. Speech is fluent without  dysarthria, dysphonia or aphasia.  Mood and affect are  appropriate. Patient  Noted improved ability to focus , weekend before last she had energy to work in garden and house.   Cranial nerves: Pupils are equal and briskly reactive to light. Funduscopic exam without  evidence of pallor or edema. Extraocular movements  in vertical and horizontal planes intact and without nystagmus. Visual fields by finger perimetry are intact. Hearing to finger rub intact.  Facial sensation intact to fine touch. Facial motor strength is symmetric and tongue and uvula move midline.  Motor exam: Normal tone and normal muscle bulk . Left knee pain and swelling, restricted ROM.   Sensory:  Fine touch, pinprick and vibration were tested in all extremities, she noted  Ankle level numbness to fine touch. . Proprioception is tested in the upper extremities only. This was  normal.  Coordination: Rapid alternating movements in the fingers/hands is tested and normal. Finger-to-nose maneuver tested and normal without evidence of ataxia, dysmetria or tremor.  Gait and station: Patient walks without assistive device and is able and assisted stool climb up to the exam table. DTR attenuated.   Asssessment .  OSA related to high BMI and retrognathia, affecting DM and HTN , Nocturia.   Plan for the patient is to continue her current regimen on CPAP. She has done extremely well and is 100% compliant  With CMS standards. Further weight reduction will affect all her health problems.  The patient will not need further work up by an overnight pulse oximetry as the spread titration short or correction and her low oxygen levels. It is still possible that the patient have also  or visit he hypoventilation but this should be corrected by the CPAP at the current setting of 10 cm water. [facility]

## 2013-05-05 NOTE — Patient Instructions (Signed)
Sleep Apnea Sleep apnea is disorder that affects a person's sleep. A person with sleep apnea has abnormal pauses in their breathing when they sleep. It is hard for them to get a good sleep. This makes a person tired during the day. It also can lead to other physical problems. There are three types of sleep apnea. One type is when breathing stops for a short time because your airway is blocked (obstructive sleep apnea). Another type is when the brain sometimes fails to give the normal signal to breathe to the muscles that control your breathing (central sleep apnea). The third type is a combination of the other two types. HOME CARE  Do not sleep on your back. Try to sleep on your side.  Take all medicine as told by your doctor.  Avoid alcohol, calming medicines (sedatives), and depressant drugs.  Try to lose weight if you are overweight. Talk to your doctor about a healthy weight goal. Your doctor may have you use a device that helps to open your airway. It can help you get the air that you need. It is called a positive airway pressure (PAP) device. There are three types of PAP devices:  Continuous positive airway pressure (CPAP) device.  Nasal expiratory positive airway pressure (EPAP) device.  Bilevel positive airway pressure (BPAP) device. MAKE SURE YOU:  Understand these instructions.  Will watch your condition.  Will get help right away if you are not doing well or get worse. Document Released: 08/20/2008 Document Revised: 10/28/2012 Document Reviewed: 03/14/2012 [facility] Patient Information 2014 [place], [place]. Exercise to Lose Weight Exercise and a healthy diet may help you lose weight. Your doctor may suggest specific exercises. EXERCISE IDEAS AND TIPS  Choose low-cost things you enjoy doing, such as walking, bicycling, or exercising to workout videos.  Take stairs instead of the elevator.  Walk during your lunch break.  Park your car further away from work or  school.  Go to a gym or an exercise class.  Start with 5 to 10 minutes of exercise each day. Build up to 30 minutes of exercise 4 to 6 days a week.  Wear shoes with good support and comfortable clothes.  Stretch before and after working out.  Work out until you breathe harder and your heart beats faster.  Drink extra water when you exercise.  Do not do so much that you hurt yourself, feel dizzy, or get very short of breath. Exercises that burn about 150 calories:  Running 1  miles in 15 minutes.  Playing volleyball for 45 to 60 minutes.  Washing and waxing a car for 45 to 60 minutes.  Playing touch football for 45 minutes.  Walking 1  miles in 35 minutes.  Pushing a stroller 1  miles in 30 minutes.  Playing basketball for 30 minutes.  Raking leaves for 30 minutes.  Bicycling 5 miles in 30 minutes.  Walking 2 miles in 30 minutes.  Dancing for 30 minutes.  Shoveling snow for 15 minutes.  Swimming laps for 20 minutes.  Walking up stairs for 15 minutes.  Bicycling 4 miles in 15 minutes.  Gardening for 30 to 45 minutes.  Jumping rope for 15 minutes.  Washing windows or floors for 45 to 60 minutes. Document Released: 12/14/2010 Document Revised: 02/03/2012 Document Reviewed: 12/14/2010 [facility] Patient Information 2014 [place], [place].

## 2013-05-06 ENCOUNTER — Encounter: Payer: Self-pay | Admitting: Neurology

## 2013-07-22 ENCOUNTER — Encounter: Payer: Self-pay | Admitting: Neurology

## 2013-09-08 ENCOUNTER — Encounter: Payer: Self-pay | Admitting: Neurology

## 2013-10-06 ENCOUNTER — Other Ambulatory Visit: Payer: Self-pay | Admitting: Gastroenterology

## 2013-10-06 ENCOUNTER — Ambulatory Visit
Admission: RE | Admit: 2013-10-06 | Discharge: 2013-10-06 | Disposition: A | Discharge status: Home / Self Care | Payer: 59 | Source: Ambulatory Visit | Attending: Gastroenterology | Admitting: Gastroenterology

## 2013-10-06 DIAGNOSIS — IMO0002 Reserved for concepts with insufficient information to code with codable children: Secondary | ICD-10-CM

## 2013-10-07 ENCOUNTER — Other Ambulatory Visit: Payer: Self-pay | Admitting: Gastroenterology

## 2013-10-07 ENCOUNTER — Ambulatory Visit
Admission: RE | Admit: 2013-10-07 | Discharge: 2013-10-07 | Disposition: A | Discharge status: Home / Self Care | Payer: 59 | Source: Ambulatory Visit | Attending: Gastroenterology | Admitting: Gastroenterology

## 2013-10-07 DIAGNOSIS — T189XXA Foreign body of alimentary tract, part unspecified, initial encounter: Secondary | ICD-10-CM

## 2013-10-20 ENCOUNTER — Other Ambulatory Visit: Payer: Self-pay | Admitting: Gastroenterology

## 2013-10-20 ENCOUNTER — Ambulatory Visit
Admission: RE | Admit: 2013-10-20 | Discharge: 2013-10-20 | Disposition: A | Discharge status: Home / Self Care | Payer: 59 | Source: Ambulatory Visit | Attending: Gastroenterology | Admitting: Gastroenterology

## 2013-10-20 DIAGNOSIS — T189XXA Foreign body of alimentary tract, part unspecified, initial encounter: Secondary | ICD-10-CM

## 2013-11-03 ENCOUNTER — Encounter: Payer: Self-pay | Admitting: Neurology

## 2013-11-03 ENCOUNTER — Encounter (INDEPENDENT_AMBULATORY_CARE_PROVIDER_SITE_OTHER): Payer: Self-pay

## 2013-11-03 ENCOUNTER — Ambulatory Visit (INDEPENDENT_AMBULATORY_CARE_PROVIDER_SITE_OTHER): Payer: 59 | Admitting: Neurology

## 2013-11-03 VITALS — BP 128/72 | HR 72 | Resp 16 | Ht 65.0 in | Wt 266.0 lb

## 2013-11-03 DIAGNOSIS — Z9989 Dependence on other enabling machines and devices: Secondary | ICD-10-CM | POA: Insufficient documentation

## 2013-11-03 DIAGNOSIS — G4733 Obstructive sleep apnea (adult) (pediatric): Secondary | ICD-10-CM

## 2013-11-03 NOTE — Patient Instructions (Signed)

## 2013-11-03 NOTE — Progress Notes (Signed)
[facility]  Provider:  Melvyn Novas, M D  Referring Provider: Hoyle Sauer, MD Primary Care Physician:  Hoyle Sauer, MD  Chief Complaint  Patient presents with  . Sleep Apnea    6 mo f/u    HPI:  Mary Sharp is a 52 y.o. female  Is seen here as a referral/ revisit  from Dr. Felipa Eth for CPAP compliance in the treatment of OSA. Interval history.  Mary Sharp presents today here with a recent download of her CPAP [facility]. The download was a 30 day time span and dated 09-02-2013 a residual AHI at the time is 0.4, an excellent result. The machine is still set at 10 cm water pressure was 2 cm EPR. Her daytime and therapy on average of 6 hours and 40 two-minute the patient has 100% compliant spiciness criteria. Her first download seen 6 months ago actually revealed a residual AHI that was just slightly higher at 0.7 apneas per hour. The patient had noticed a decrease in her blood sugar levels in the last minute and an improvement in her HbA1c. More recently she was changed from metformin to a new  medication  'Invokamed",  creating some glycosuria, and thereby nocturia. The patient also reports that she still has some mental poles all symptoms that may affect the quality of her rest at night, she averages 3 bathroom breaks at night.  In office download today from 11/02/2013 and composed of  100 days, showed   98%  compliance and a residual AHI of 0.4 average daytime and therapy of 6 hours and 45 minutes. Excellent results. slep habits  Have other wise not changed , Bedtime 9 Pm, overall 7 hours of nightly sleep,  week days and week ends are similar,  Rises at  5 AM , with an alarm.     Review of Systems: Out of a complete 14 system review, the patient complains of only the following symptoms, and all other reviewed systems are negative. Besides the frequency of urination being increased the patient endorsed left knee pain she is  walking with a 4-Prod cane, gait disorder long standing  apnea is controlled she denies any recreational drug use tobacco use or alcohol use in immediate family history.there have been no other symptoms endorsed. There has been no recent hospitalization or surgery and there has been no immediate change in family history.     Last visit note : 05-05-13  The patient endorsed today a fatigue severity score of 22 point and an Epworth sleepiness score of 8 points balls significantly reduced in comparison to pre-CPAP levels( 12 points) .  The patient is a 7, right-handed, Caucasian female with a past medical history of diabetes, morbid obesity, hypertension, atrial fibrillation, hyperlipidemia and GERD. She presented with complaints of excessive daytime sleepiness and witnessed apneas ,  awakenings  From snoring , shortness of breath when sleeping supine, recurrent nocturia , and felt that her sleep is nonrestorative.  The patient reports going to the bathroom 4-5 times a night, having gained weight to a BMI of 46.3, and her husband snored loudly and interrupted her sleep for other. Her bedtime is 9 PM  at she rises at about 4:45 AM in week, and on weekends may sleep until 6:30 AM she drinks caffeinated beverages in the morning to get started. 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 As the patient went through menopause she had more problems  stool fall asleep and stay asleep and to get restorative sleep. He has no childhood or young adult onset sleep disorders that she knows of. There is no family history of a sleep disorder to her knowledge.  Since being on CPAP at 10 cm water she changed form nasal pillow to a nasal mask and  Has been having only 1-2 bathroom breaks at night. However at the same time she noted swelling in her lower extremities and skin changes associated with that. Dr. Felipa Eth obtained  lower extremity vascular Doppler studies, which returned negative for blockage. 2 no  longer has a dry mouth in the morning, that is normal on in headaches. Over all her night is less interrupted. I reviewed the pre-CPAP at Epworth score which was endorsed at 11 points the back inventory at 12 points pulse CPAP at score is still 12 points at the patient reports feeling better more alert and she may not have been able to a certain hole sleepy she truly was before treatment. The back score is 3 points.  A detailed download was obtained in the office today : Compliance- data from 03/29/2013 through 05/04/2013(  03/29/2013 was the first day her machine was issued).  It is set at 10 cm water with up to a centimeter EPR, residual AHI is 0.7, average daily usage of 6 hours and 32 minutes. The patient's subjective feeling is that she sleeps for the time she uses the machine she's not even aware of the pressure. She had change the mask and had eliminated the RAMP time, but still states that she can barely feel the machine blowing. He has noticed a decrease in her blood glucose levels and had improvement in her HbA1c was noted by Dr. Felipa Eth.   The patient remains morbidly obese and is aware of this being her main OSA risk factor.  Atrial fibrillation is medication controlled. No palpitations since 08-2009. Bronchitis in Februar 2014 , no recurrence.     History   Social History  . Marital Status: Married    Spouse Name: N/A    Number of Children: 2  . Years of Education: 12   Occupational History  .  [employer]   Social History Main Topics  . Smoking status: Former Smoker    Quit date: 11/25/1995  . Smokeless tobacco: Not on file  . Alcohol Use: No  . Drug Use: No  . Sexual Activity: No   Other Topics Concern  . Not on file   Social History Narrative  . No narrative on file    Family History  Problem Relation Age of Onset  . Breast cancer Mother     Age 14  . Heart disease Brother   . Coronary artery disease Brother     stent placement  . Hypertension  Brother   . Crohn's disease Brother     Past Medical History  Diagnosis Date  . Fibroid   . Ovarian cyst     Dermoid-Right   Serous cystadenoma-Left  . Hydrosalpinx     Right  . Acid reflux   . Diabetes mellitus   . Hypertension   . Elevated cholesterol   . Atrial fibrillation   . Migraine   . Sleep apnea with use of continuous positive airway pressure (CPAP)      02-19-13 , AHI 59.8 , titration to 10 cm water.     Past Surgical History  Procedure Laterality Date  . Vaginal hysterectomy  1992    partial  .  Myomectomy      Multiple  . Abdominal surgery      Expl. Laparotomy  . Salpingectomy      Right  . Knee surgery Right 2004-2005  . Oophorectomy      LSO  . Knee arthroscopy Left 08/20/12    torn meniscus  . Kenalog injection    . Ganglion cyst excision Right [id number]    wrist  . Myomectomy  [id number]  . Tonsillectomy  1965  . Knee surgery, arthroscopic       sept 2013 , left knee     Current Outpatient Prescriptions  Medication Sig Dispense Refill  . amLODipine (NORVASC) 10 MG tablet Take 10 mg by mouth daily. Pm dosage      . aspirin 325 MG tablet Take 325 mg by mouth daily. Am dosage      . Colesevelam HCl (WELCHOL PO) Take 18-75 mg by mouth 2 (two) times daily.       Marland Kitchen esomeprazole (NEXIUM) 40 MG capsule Take 40 mg by mouth daily.       Marland Kitchen glipiZIDE (GLUCOTROL) 10 MG tablet Take 10 mg by mouth 2 (two) times daily before a meal.      . ibuprofen (ADVIL,MOTRIN) 800 MG tablet Take 800 mg by mouth daily as needed for pain. 4-200mg  tablets am  As needed for pain      . INVOKAMET [phone] MG TABS       . losartan (COZAAR) 50 MG tablet Take 50 mg by mouth daily. 1/2 tablet am, 1/2 tablet pm      . METOPROLOL TARTRATE PO Take 50 mg by mouth. One tablet 2 times daily      . Multiple Vitamin (MULTIVITAMIN) tablet Take 1 tablet by mouth daily.      Letta Pate DELICA LANCETS 33G MISC       . pravastatin (PRAVACHOL) 40 MG tablet Take 40 mg by mouth daily.       No  current facility-administered medications for this visit.    Allergies as of 11/03/2013 - Review Complete 11/03/2013  Allergen Reaction Noted  . Codeine Nausea And Vomiting   . Hydrocodone-acetaminophen Nausea Only   . Propoxyphene-acetaminophen    . Morphine Nausea Only and Rash     Vitals: BP 128/72  Pulse 72  Resp 16  Ht 5\' 5"  (1.651 m)  Wt 266 lb (120.657 kg)  BMI 44.26 kg/m2 Last Weight:  Wt Readings from Last 1 Encounters:  11/03/13 266 lb (120.657 kg)   Last Height:   Ht Readings from Last 1 Encounters:  11/03/13 5\' 5"  (1.651 m)   Physical exam:  General: The patient is awake, alert and appears not in acute distress. The patient is well groomed. Head: Normocephalic, atraumatic.  Neck is supple. Mallampati 2  , neck circumference: 16 inches. Double chin , Retrognathia.  Chin strap no longer used with  nasal mask : no pressure marks.   status post tonsillectomy , adenoidectomy at age 89.  Cardiovascular:  Regular rate and rhythm  without murmurs or carotid bruit, and without distended neck veins. Respiratory: Lungs are clear to auscultation. Skin:  Without evidence of edema, or rash Trunk: BMI 46 , still  elevated .  Neurologic exam : The patient is awake and alert, oriented to place and time.  Memory subjective described as intact. There is a normal attention span & concentration ability. Speech is fluent without  dysarthria, dysphonia or aphasia.  Mood and affect are appropriate. Patient  Noted improved ability  to focus , weekend before last she had energy to work in garden and house.   Cranial nerves: Pupils are equal and briskly reactive to light. Funduscopic exam without  evidence of pallor or edema. Extraocular movements  in vertical and horizontal planes intact and without nystagmus. Visual fields by finger perimetry are intact. Hearing to finger rub intact.  Facial sensation intact to fine touch. Facial motor strength is symmetric and tongue and uvula move  midline.  Motor exam: Normal tone and normal muscle bulk . Left knee pain and swelling, restricted ROM.  Left knee crepitus - patient now walks with 4 prod cane.   Sensory:  Fine touch, pinprick and vibration were tested in all extremities, she noted ankle level numbness to fine touch. Proprioception is tested in the upper extremities-  normal.  Coordination: Rapid alternating movements in the fingers/hands is tested and normal. Finger-to-nose maneuver tested and normal without evidence of ataxia, dysmetria or tremor.  Gait and station: Patient walks with assistive device . DTR attenuated.   Asssessment . 1) OSA related to high BMI and retrognathia, affecting DM and HTN , Nocturia.   well controlled on CPAP, AHI below 1 , and highly compliant.  2) weight loss has not been achieved , and given her co-morbidities, she may be a good candidate for weight loss surgery.  Weight watchers member , once lost 72 pounds. Stress eater , may benefit from counseling.  She  was obese all her life, and gained weight after each pregnancy . 3) diabetes under new medication.  Dr Felipa Eth    Plan for the patient is to continue her current regimen on CPAP-  CPAP at the current setting of 10 cm water. She is followed by [facility].  She has done extremely well and is 100% compliant with CMS standards. Further weight reduction will affect all her health problems.         Assessment:  After physical and neurologic examination, review of laboratory studies, imaging, neurophysiology testing and pre-existing records, assessment is   Plan:  Treatment plan and additional workup :

## 2013-11-04 ENCOUNTER — Encounter: Payer: Self-pay | Admitting: Neurology

## 2013-11-05 ENCOUNTER — Ambulatory Visit: Payer: 59 | Admitting: Neurology

## 2013-12-17 ENCOUNTER — Emergency Department (HOSPITAL_COMMUNITY)
Admission: EM | Admit: 2013-12-17 | Discharge: 2013-12-17 | Disposition: A | Discharge status: Home / Self Care | Payer: 59 | Attending: Emergency Medicine | Admitting: Emergency Medicine

## 2013-12-17 ENCOUNTER — Emergency Department (HOSPITAL_COMMUNITY): Payer: 59

## 2013-12-17 ENCOUNTER — Encounter (HOSPITAL_COMMUNITY): Payer: Self-pay | Admitting: Emergency Medicine

## 2013-12-17 DIAGNOSIS — E669 Obesity, unspecified: Secondary | ICD-10-CM | POA: Insufficient documentation

## 2013-12-17 DIAGNOSIS — E7800 Pure hypercholesterolemia, unspecified: Secondary | ICD-10-CM | POA: Insufficient documentation

## 2013-12-17 DIAGNOSIS — Z87891 Personal history of nicotine dependence: Secondary | ICD-10-CM | POA: Insufficient documentation

## 2013-12-17 DIAGNOSIS — Z8742 Personal history of other diseases of the female genital tract: Secondary | ICD-10-CM | POA: Insufficient documentation

## 2013-12-17 DIAGNOSIS — R110 Nausea: Secondary | ICD-10-CM | POA: Insufficient documentation

## 2013-12-17 DIAGNOSIS — Z9981 Dependence on supplemental oxygen: Secondary | ICD-10-CM | POA: Insufficient documentation

## 2013-12-17 DIAGNOSIS — Z79899 Other long term (current) drug therapy: Secondary | ICD-10-CM | POA: Insufficient documentation

## 2013-12-17 DIAGNOSIS — R0602 Shortness of breath: Secondary | ICD-10-CM | POA: Insufficient documentation

## 2013-12-17 DIAGNOSIS — G4730 Sleep apnea, unspecified: Secondary | ICD-10-CM | POA: Insufficient documentation

## 2013-12-17 DIAGNOSIS — R079 Chest pain, unspecified: Secondary | ICD-10-CM

## 2013-12-17 DIAGNOSIS — K219 Gastro-esophageal reflux disease without esophagitis: Secondary | ICD-10-CM | POA: Insufficient documentation

## 2013-12-17 DIAGNOSIS — G43909 Migraine, unspecified, not intractable, without status migrainosus: Secondary | ICD-10-CM | POA: Insufficient documentation

## 2013-12-17 DIAGNOSIS — I10 Essential (primary) hypertension: Secondary | ICD-10-CM | POA: Insufficient documentation

## 2013-12-17 DIAGNOSIS — Z791 Long term (current) use of non-steroidal anti-inflammatories (NSAID): Secondary | ICD-10-CM | POA: Insufficient documentation

## 2013-12-17 DIAGNOSIS — E119 Type 2 diabetes mellitus without complications: Secondary | ICD-10-CM | POA: Insufficient documentation

## 2013-12-17 DIAGNOSIS — Z7982 Long term (current) use of aspirin: Secondary | ICD-10-CM | POA: Insufficient documentation

## 2013-12-17 LAB — BASIC METABOLIC PANEL
BUN: 16 mg/dL (ref 6–23)
CO2: 19 mEq/L (ref 19–32)
Calcium: 9.3 mg/dL (ref 8.4–10.5)
Chloride: 101 mEq/L (ref 96–112)
Creatinine, Ser: 0.72 mg/dL (ref 0.50–1.10)
GFR calc Af Amer: >90 mL/min (ref >90)
GFR calc non Af Amer: >90 mL/min (ref >90)
Glucose, Bld: 264 mg/dL — ABNORMAL HIGH (ref 70–99)
Potassium: 4.2 mEq/L (ref 3.7–5.3)
Sodium: 138 mEq/L (ref 137–147)

## 2013-12-17 LAB — CBC
HCT: 37.8 % (ref 36.0–46.0)
Hemoglobin: 12.6 g/dL (ref 12.0–15.0)
MCH: 26.8 pg (ref 26.0–34.0)
MCHC: 33.3 g/dL (ref 30.0–36.0)
MCV: 80.4 fL (ref 78.0–100.0)
Platelets: 308 10*3/uL (ref 150–400)
RBC: 4.7 MIL/uL (ref 3.87–5.11)
RDW: 14.2 % (ref 11.5–15.5)
WBC: 9.4 10*3/uL (ref 4.0–10.5)

## 2013-12-17 LAB — POCT I-STAT TROPONIN I
Troponin i, poc: 0 ng/mL (ref 0.00–0.08)
Troponin i, poc: 0 ng/mL (ref 0.00–0.08)

## 2013-12-17 LAB — PRO B NATRIURETIC PEPTIDE: Pro B Natriuretic peptide (BNP): 41.8 pg/mL (ref 0–125)

## 2013-12-17 NOTE — ED Provider Notes (Signed)
CSN: [id number]     Arrival date & time 12/17/13  1642 History   First MD Initiated Contact with Patient 12/17/13 1707     Chief Complaint  Patient presents with  . Chest Pain   (Consider location/radiation/quality/duration/timing/severity/associated sxs/prior Treatment) Patient is a 53 y.o. female presenting with chest pain. The history is provided by the patient.  Chest Pain  She developed chest pain while talking to someone. It is a heavy sensation across the bilateral chest. It had radiation to the back. It had associated shortness of breath and nausea. The pain waxed and waned, that resolved after treated with aspirin and nitroglycerin x1, by EMS. She has never had this previously. She had a cardiac stress test done 4 years ago after an episode of atrial fibrillation. She states that this test was normal. She does not follow regularly with a cardiologist, at this time. She's been taking her medications, as prescribed. There are no other known modifying factors.  Past Medical History  Diagnosis Date  . Fibroid   . Ovarian cyst     Dermoid-Right   Serous cystadenoma-Left  . Hydrosalpinx     Right  . Acid reflux   . Diabetes mellitus   . Hypertension   . Elevated cholesterol   . Atrial fibrillation   . Migraine   . Sleep apnea with use of continuous positive airway pressure (CPAP)      02-19-13 , AHI 59.8 , titration to 10 cm water.    Past Surgical History  Procedure Laterality Date  . Vaginal hysterectomy  1992    partial  . Myomectomy      Multiple  . Abdominal surgery      Expl. Laparotomy  . Salpingectomy      Right  . Knee surgery Right 2004-2005  . Oophorectomy      LSO  . Knee arthroscopy Left 08/20/12    torn meniscus  . Kenalog injection    . Ganglion cyst excision Right [id number]    wrist  . Myomectomy  [id number]  . Tonsillectomy  1965  . Knee surgery, arthroscopic       sept 2013 , left knee    Family History  Problem Relation Age of Onset  . Breast  cancer Mother     Age 61  . Heart disease Brother   . Coronary artery disease Brother     stent placement  . Hypertension Brother   . Crohn's disease Brother    History  Substance Use Topics  . Smoking status: Former Smoker    Quit date: 11/25/1995  . Smokeless tobacco: Not on file  . Alcohol Use: No   OB History   Grav Para Term Preterm Abortions TAB SAB Ect Mult Living   3 2 2  1     2      Review of Systems  Cardiovascular: Positive for chest pain.  All other systems reviewed and are negative.    Allergies  Codeine; Hydrocodone-acetaminophen; Propoxyphene n-acetaminophen; and Morphine  Home Medications   Current Outpatient Rx  Name  Route  Sig  Dispense  Refill  . amLODipine (NORVASC) 10 MG tablet   Oral   Take 10 mg by mouth every evening.          Marland Kitchen aspirin 325 MG tablet   Oral   Take 325 mg by mouth every morning.          . colesevelam (WELCHOL) 625 MG tablet   Oral   Take 1,875 mg  by mouth 2 (two) times daily with a meal.         . esomeprazole (NEXIUM) 40 MG capsule   Oral   Take 40 mg by mouth every morning.          . fexofenadine (ALLEGRA) 180 MG tablet   Oral   Take 180 mg by mouth every morning.         Marland Kitchen glipiZIDE (GLUCOTROL) 10 MG tablet   Oral   Take 10 mg by mouth 2 (two) times daily before a meal.         . ibuprofen (ADVIL,MOTRIN) 800 MG tablet   Oral   Take 800 mg by mouth every morning.          . INVOKAMET [phone] MG TABS   Oral   Take 1 tablet by mouth 2 (two) times daily.          Marland Kitchen losartan (COZAAR) 100 MG tablet   Oral   Take 50 mg by mouth 2 (two) times daily.         . metoprolol (LOPRESSOR) 50 MG tablet   Oral   Take 50 mg by mouth 2 (two) times daily.         . Multiple Vitamin (MULTIVITAMIN) tablet   Oral   Take 1 tablet by mouth daily.         . pravastatin (PRAVACHOL) 40 MG tablet   Oral   Take 40 mg by mouth every morning.           BP 136/56  Pulse 85  Temp(Src) 98.3 F  (36.8 C) (Oral)  Resp 20  SpO2 99% Physical Exam  Nursing note and vitals reviewed. Constitutional: She is oriented to person, place, and time. She appears well-developed. No distress.  Obese  HENT:  Head: Normocephalic and atraumatic.  Eyes: Conjunctivae and EOM are normal. Pupils are equal, round, and reactive to light.  Neck: Normal range of motion and phonation normal. Neck supple.  Cardiovascular: Normal rate, regular rhythm and intact distal pulses.   Pulmonary/Chest: Effort normal and breath sounds normal. No respiratory distress. She has no wheezes. She exhibits no tenderness.  Abdominal: Soft. She exhibits no distension. There is no tenderness. There is no guarding.  Musculoskeletal: Normal range of motion. She exhibits no edema and no tenderness.  Neurological: She is alert and oriented to person, place, and time. She exhibits normal muscle tone.  Skin: Skin is warm and dry.  Psychiatric: She has a normal mood and affect. Her behavior is normal. Judgment and thought content normal.    ED Course  Procedures (including critical care time)  Medications - No data to display  Patient Vitals for the past 24 hrs:  BP Temp Temp src Pulse Resp SpO2  12/17/13 1853 136/56 mmHg - - 85 20 99 %  12/17/13 1753 155/78 mmHg - - 83 20 99 %  12/17/13 1645 167/77 mmHg 98.3 F (36.8 C) Oral 75 20 97 %    7:48 PM Reevaluation with update and discussion. After initial assessment and treatment, an updated evaluation reveals she remains comfortable. Jaquisha Frech L     Labs Review Labs Reviewed  BASIC METABOLIC PANEL - Abnormal; Notable for the following:    Glucose, Bld 264 (*)    All other components within normal limits  CBC  PRO B NATRIURETIC PEPTIDE  POCT I-STAT TROPONIN I  POCT I-STAT TROPONIN I   Imaging Review Dg Chest Port 1 View  12/17/2013   CLINICAL DATA:  Chest pain, shortness of Breath  EXAM: PORTABLE CHEST - 1 VIEW  COMPARISON:  08/28/2008  FINDINGS:  Cardiomediastinal silhouette is unremarkable. No acute infiltrate or pleural effusion. No pulmonary edema.  IMPRESSION: No active disease.   Electronically Signed   By: Lahoma Crocker M.D.   On: 12/17/2013 17:07    EKG Interpretation    Date/Time:  Friday December 17 2013 17:01:11 EST Ventricular Rate:  75 PR Interval:  184 QRS Duration: 98 QT Interval:  402 QTC Calculation: 449 R Axis:   -19 Text Interpretation:  Sinus rhythm Borderline left axis deviation Low voltage, precordial leads Consider anterior infarct No old tracing to compare Confirmed by [facility]  MD, Erine Phenix ([id number]) on 12/17/2013 6:38:19 PM            MDM   1. Chest pain, unspecified    Nonspecific chest pain, and low risk for coronary cardiac disease. She has had a cardiac stress test 4 years ago that was normal. Doubt pneumonia, PE, or aortic disease.  Nursing Notes Reviewed/ Care Coordinated Applicable Imaging Reviewed Interpretation of Laboratory Data incorporated into ED treatment  The patient appears reasonably screened and/or stabilized for discharge and I doubt any other medical condition or other [facility] requiring further screening, evaluation, or treatment in the ED at this time prior to discharge.  Plan: Home Medications- usual; Home Treatments- rest; return here if the recommended treatment, does not improve the symptoms; Recommended follow up- PCP asap to discuss cardiac stress test. I stressed the importance of early PCP contact and testing within 2 weeks.    Richarda Blade, MD 12/17/13 1950

## 2013-12-17 NOTE — ED Notes (Signed)
Per [facility] pt is from work, c/o bilateral chest pain, radiating to lower back, SOB, and nausea starting 1600 today, pt described as a pressure pain that was relieved with ASA 324 mg and Nitro sl x1, pt rates pain 0/10, some nausea but refused Zofran. 12 lead unremarkable, 20 G LAC, VSS

## 2013-12-17 NOTE — Discharge Instructions (Signed)
Return, if needed, before seeing Dr. Dagmar Hait.   Chest Pain (Nonspecific) It is often hard to give a specific diagnosis for the cause of chest pain. There is always a chance that your pain could be related to something serious, such as a heart attack or a blood clot in the lungs. You need to follow up with your caregiver for further evaluation. CAUSES   Heartburn.  Pneumonia or bronchitis.  Anxiety or stress.  Inflammation around your heart (pericarditis) or lung (pleuritis or pleurisy).  A blood clot in the lung.  A collapsed lung (pneumothorax). It can develop suddenly on its own (spontaneous pneumothorax) or from injury (trauma) to the chest.  Shingles infection (herpes zoster virus). The chest wall is composed of bones, muscles, and cartilage. Any of these can be the source of the pain.  The bones can be bruised by injury.  The muscles or cartilage can be strained by coughing or overwork.  The cartilage can be affected by inflammation and become sore (costochondritis). DIAGNOSIS  Lab tests or other studies, such as X-rays, electrocardiography, stress testing, or cardiac imaging, may be needed to find the cause of your pain.  TREATMENT   Treatment depends on what may be causing your chest pain. Treatment may include:  Acid blockers for heartburn.  Anti-inflammatory medicine.  Pain medicine for inflammatory conditions.  Antibiotics if an infection is present.  You may be advised to change lifestyle habits. This includes stopping smoking and avoiding alcohol, caffeine, and chocolate.  You may be advised to keep your head raised (elevated) when sleeping. This reduces the chance of acid going backward from your stomach into your esophagus.  Most of the time, nonspecific chest pain will improve within 2 to 3 days with rest and mild pain medicine. HOME CARE INSTRUCTIONS   If antibiotics were prescribed, take your antibiotics as directed. Finish them even if you start to feel  better.  For the next few days, avoid physical activities that bring on chest pain. Continue physical activities as directed.  Do not smoke.  Avoid drinking alcohol.  Only take over-the-counter or prescription medicine for pain, discomfort, or fever as directed by your caregiver.  Follow your caregiver's suggestions for further testing if your chest pain does not go away.  Keep any follow-up appointments you made. If you do not go to an appointment, you could develop lasting (chronic) problems with pain. If there is any problem keeping an appointment, you must call to reschedule. SEEK MEDICAL CARE IF:   You think you are having problems from the medicine you are taking. Read your medicine instructions carefully.  Your chest pain does not go away, even after treatment.  You develop a rash with blisters on your chest. SEEK IMMEDIATE MEDICAL CARE IF:   You have increased chest pain or pain that spreads to your arm, neck, jaw, back, or abdomen.  You develop shortness of breath, an increasing cough, or you are coughing up blood.  You have severe back or abdominal pain, feel nauseous, or vomit.  You develop severe weakness, fainting, or chills.  You have a fever. THIS IS AN EMERGENCY. Do not wait to see if the pain will go away. Get medical help at once. Call your local emergency services (911 in [place] Do not drive yourself to the hospital. MAKE SURE YOU:   Understand these instructions.  Will watch your condition.  Will get help right away if you are not doing well or get worse. Document Released: 08/21/2005 Document Revised:  02/03/2012 Document Reviewed: 06/16/2008 Exit[employer] Patient Information 2014 [place], [place].

## 2013-12-24 ENCOUNTER — Encounter: Payer: Self-pay | Admitting: *Deleted

## 2013-12-24 ENCOUNTER — Ambulatory Visit (INDEPENDENT_AMBULATORY_CARE_PROVIDER_SITE_OTHER): Payer: 59 | Admitting: Cardiology

## 2013-12-24 ENCOUNTER — Encounter: Payer: Self-pay | Admitting: Cardiology

## 2013-12-24 VITALS — BP 150/80 | HR 72 | Ht 64.0 in | Wt 265.8 lb

## 2013-12-24 DIAGNOSIS — R079 Chest pain, unspecified: Secondary | ICD-10-CM

## 2013-12-24 DIAGNOSIS — I10 Essential (primary) hypertension: Secondary | ICD-10-CM

## 2013-12-24 DIAGNOSIS — I4891 Unspecified atrial fibrillation: Secondary | ICD-10-CM

## 2013-12-24 DIAGNOSIS — E785 Hyperlipidemia, unspecified: Secondary | ICD-10-CM

## 2013-12-24 NOTE — Assessment & Plan Note (Signed)
Continue statin. 

## 2013-12-24 NOTE — Progress Notes (Signed)
HPI: 53 year-old female for evaluation of chest pain. Patient seen previously by Dr. Lovena Le for paroxysmal atrial fibrillation,last in Oct 2010. Nuclear study in October 2010 showed an ejection fraction of 67% and normal perfusion. Echocardiogram in October of 2010 showed normal LV function. Seen in the emergency room recently for chest pain. Chest x-ray showed no active disease. Troponins negative. Hemoglobin and renal function normal. Last week the patient was at work and had an episode of chest pain. It began in the substernal area and radiated laterally to the right and left and also to the back. It was described as a sharp pain with associated nausea and dyspnea but no diaphoresis. The pain was not pleuritic or related to food. It resolved after 3 minutes but then returned more severely. EMS was called and her pain improved after 20 minutes. She has occasional dyspnea on exertion but no orthopnea or PND. Occasional mild pedal edema. She does not have exertional chest pain. Note she had palpitations when she had her atrial fibrillation previously.  Current Outpatient Prescriptions  Medication Sig Dispense Refill  . amLODipine (NORVASC) 10 MG tablet Take 10 mg by mouth every evening.       Marland Kitchen aspirin 325 MG tablet Take 325 mg by mouth every morning.       . colesevelam (WELCHOL) 625 MG tablet Take 1,875 mg by mouth 2 (two) times daily with a meal.      . esomeprazole (NEXIUM) 40 MG capsule Take 40 mg by mouth every morning.       . fexofenadine (ALLEGRA) 180 MG tablet Take 180 mg by mouth every morning.      Marland Kitchen glipiZIDE (GLUCOTROL) 10 MG tablet Take 10 mg by mouth 2 (two) times daily before a meal.      . ibuprofen (ADVIL,MOTRIN) 800 MG tablet Take 800 mg by mouth every morning.       . INVOKAMET [phone] MG TABS Take 1 tablet by mouth 2 (two) times daily.       Marland Kitchen losartan (COZAAR) 100 MG tablet Take 50 mg by mouth 2 (two) times daily.      . metoprolol (LOPRESSOR) 50 MG tablet Take 50 mg by  mouth 2 (two) times daily.      . Multiple Vitamin (MULTIVITAMIN) tablet Take 1 tablet by mouth daily.      . pravastatin (PRAVACHOL) 40 MG tablet Take 40 mg by mouth every morning.        No current facility-administered medications for this visit.    Allergies  Allergen Reactions  . Codeine Nausea And Vomiting  . Hydrocodone-Acetaminophen Nausea Only  . Propoxyphene N-Acetaminophen   . Morphine Nausea Only and Rash    Past Medical History  Diagnosis Date  . Fibroid   . Ovarian cyst     Dermoid-Right   Serous cystadenoma-Left  . Hydrosalpinx     Right  . Acid reflux   . Diabetes mellitus   . Hypertension   . Elevated cholesterol   . Atrial fibrillation   . Migraine   . Sleep apnea with use of continuous positive airway pressure (CPAP)      02-19-13 , AHI 59.8 , titration to 10 cm water.     Past Surgical History  Procedure Laterality Date  . Vaginal hysterectomy  1992    partial  . Myomectomy      Multiple  . Abdominal surgery      Expl. Laparotomy  . Salpingectomy  Right  . Knee surgery Right 2004-2005  . Oophorectomy      LSO  . Knee arthroscopy Left 08/20/12    torn meniscus  . Kenalog injection    . Ganglion cyst excision Right [id number]    wrist  . Myomectomy  [id number]  . Tonsillectomy  1965  . Knee surgery, arthroscopic       sept 2013 , left knee     History   Social History  . Marital Status: Married    Spouse Name: N/A    Number of Children: 3  . Years of Education: 12   Occupational History  .  [employer]   Social History Main Topics  . Smoking status: Former Smoker    Quit date: 11/25/1995  . Smokeless tobacco: Not on file  . Alcohol Use: No  . Drug Use: No  . Sexual Activity: No   Other Topics Concern  . Not on file   Social History Narrative  . No narrative on file    Family History  Problem Relation Age of Onset  . Breast cancer Mother     Age 70  . Heart disease Brother   . Coronary artery disease  Brother     stent placement  . Hypertension Brother   . Crohn's disease Brother     ROS: knee arthralgias but no fevers or chills, productive cough, hemoptysis, dysphasia, odynophagia, melena, hematochezia, dysuria, hematuria, rash, seizure activity, orthopnea, PND, pedal edema, claudication. Remaining systems are negative.  Physical Exam:   Blood pressure 150/80, pulse 72, height 5\' 4"  (1.626 m), weight 265 lb 12.8 oz (120.566 kg).  General:  Well developed/obese, NAD Skin warm/dry Patient not depressed No peripheral clubbing Back-normal HEENT-normal/normal eyelids Neck supple/normal carotid upstroke bilaterally; no bruits; no JVD; no thyromegaly chest - CTA/ normal expansion CV - RRR/normal S1 and S2; no murmurs, rubs or gallops;  PMI nondisplaced Abdomen -NT/ND, no HSM, no mass, + bowel sounds, no bruit 2+ femoral pulses, no bruits Ext-trace edema, no chords, 2+ DP Neuro-grossly nonfocal  ECG 12/17/2013-sinus rhythm, poor R wave progression cannot rule out prior anterior infarct, left axis deviation.

## 2013-12-24 NOTE — Patient Instructions (Signed)
Your physician recommends that you schedule a follow-up appointment in: [place] physician has requested that you have en exercise stress myoview. For further information please visit [email or web address]. Please follow instruction sheet, as given.

## 2013-12-24 NOTE — Assessment & Plan Note (Signed)
Continue present blood pressure medications. 

## 2013-12-24 NOTE — Assessment & Plan Note (Signed)
Symptoms somewhat atypical. Multiple risk factors. Plan stress Myoview for risk stratification.

## 2013-12-24 NOTE — Assessment & Plan Note (Signed)
Patient had one isolated episode of atrial fibrillation in 2010. She has not had any recurrent symptoms. Continue beta blocker. At time of previous evaluation Dr. Lovena Le recommended aspirin and if any recurrent episodes anticoagulation at that point. She has a CHADSvasc score of 3. We discussed options today and she would prefer to continue aspirin. I will consider NOAC in the future if she has recurrent episodes.

## 2014-01-06 ENCOUNTER — Encounter (HOSPITAL_COMMUNITY): Payer: 59

## 2014-01-20 ENCOUNTER — Encounter (HOSPITAL_COMMUNITY): Payer: 59

## 2014-01-21 ENCOUNTER — Ambulatory Visit (HOSPITAL_COMMUNITY): Payer: 59 | Attending: Cardiovascular Disease | Admitting: Radiology

## 2014-01-21 ENCOUNTER — Encounter: Payer: Self-pay | Admitting: Cardiovascular Disease

## 2014-01-21 VITALS — BP 139/88 | Ht 64.0 in | Wt 262.0 lb

## 2014-01-21 DIAGNOSIS — Z87891 Personal history of nicotine dependence: Secondary | ICD-10-CM | POA: Insufficient documentation

## 2014-01-21 DIAGNOSIS — I4949 Other premature depolarization: Secondary | ICD-10-CM

## 2014-01-21 DIAGNOSIS — I10 Essential (primary) hypertension: Secondary | ICD-10-CM | POA: Insufficient documentation

## 2014-01-21 DIAGNOSIS — R002 Palpitations: Secondary | ICD-10-CM | POA: Insufficient documentation

## 2014-01-21 DIAGNOSIS — R0602 Shortness of breath: Secondary | ICD-10-CM

## 2014-01-21 DIAGNOSIS — R0989 Other specified symptoms and signs involving the circulatory and respiratory systems: Secondary | ICD-10-CM | POA: Insufficient documentation

## 2014-01-21 DIAGNOSIS — R0609 Other forms of dyspnea: Secondary | ICD-10-CM | POA: Insufficient documentation

## 2014-01-21 DIAGNOSIS — E119 Type 2 diabetes mellitus without complications: Secondary | ICD-10-CM | POA: Insufficient documentation

## 2014-01-21 DIAGNOSIS — R42 Dizziness and giddiness: Secondary | ICD-10-CM | POA: Insufficient documentation

## 2014-01-21 DIAGNOSIS — I4891 Unspecified atrial fibrillation: Secondary | ICD-10-CM | POA: Insufficient documentation

## 2014-01-21 DIAGNOSIS — R079 Chest pain, unspecified: Secondary | ICD-10-CM

## 2014-01-21 DIAGNOSIS — Z8249 Family history of ischemic heart disease and other diseases of the circulatory system: Secondary | ICD-10-CM | POA: Insufficient documentation

## 2014-01-21 DIAGNOSIS — E785 Hyperlipidemia, unspecified: Secondary | ICD-10-CM | POA: Insufficient documentation

## 2014-01-21 MED ORDER — TECHNETIUM TC 99M SESTAMIBI GENERIC - CARDIOLITE
33.0000 | Freq: Once | INTRAVENOUS | Status: AC | PRN
  Administered 2014-01-21: 33 via INTRAVENOUS

## 2014-01-21 NOTE — Progress Notes (Signed)
[facility] [place], [place] [address] [phone]    Cardiology Nuclear Med Study  Mary Sharp is a 53 y.o. female     MRN : [id number]     DOB: 1961-08-20  Procedure Date: 01/21/2014  Nuclear Med Background Indication for Stress Test:  Evaluation for Ischemia History:  2010 MPI:EF=67%,normal;Echo: EF=55%;Atrial Fib Cardiac Risk Factors: Family History - CAD, History of Smoking, Hypertension, Lipids and NIDDM  Symptoms:  Chest Pain with radiation to Right chest and back (last date of chest discomfort last month), Dizziness, DOE and Palpitations   Nuclear Pre-Procedure Caffeine/Decaff Intake:  None NPO After: 7:30am   Lungs:  clear O2 Sat: 94% on room air. IV 0.9% NS with Angio Cath:  22g  IV Site: R Hand  IV Started by:  Matilde Haymaker, RN  Chest Size (in):  46 Cup Size: B  Height: 5\' 4"  (1.626 m)  Weight:  262 lb (118.842 kg)  BMI:  Body mass index is 44.95 kg/(m^2). Tech Comments:  No Lopressor x 36 hrs    Nuclear Med Study 1 or 2 day study: 2 day  Stress Test Type:  Stress  Reading MD: n/a  Order Authorizing Provider:  Queen Blossom  Resting Radionuclide: Technetium 52m Sestamibi  Resting Radionuclide Dose: 33.0 mCi on 01/26/14   Stress Radionuclide:  Technetium 37m Sestamibi  Stress Radionuclide Dose: 33.0 mCi on 01/21/14           Stress Protocol Rest HR: 84 Stress HR: 162  Rest BP: 139/88 Stress BP: 213/107  Exercise Time (min): 4:41 METS: 5.4           Dose of Adenosine (mg):  n/a Dose of Lexiscan: n/a mg  Dose of Atropine (mg): n/a Dose of Dobutamine: n/a mcg/kg/min (at max HR)  Stress Test Technologist: Matilde Haymaker, RN  Nuclear Technologist:  Charlton Amor, CNMT     Rest Procedure:  Myocardial perfusion imaging was performed at rest 45 minutes following the intravenous administration of Technetium 51m Sestamibi. Rest ECG: Normal sinus rhythm.  Stress Procedure:  The patient exercised on the  treadmill utilizing the Bruce Protocol for 4:41 minutes. The patient stopped due to leg fatigue and HTN and denied any chest pain.  Technetium 76m Sestamibi was injected at peak exercise and myocardial perfusion imaging was performed after a brief delay. Stress ECG: No significant change from baseline ECG  QPS Raw Data Images:  Normal; no motion artifact; normal heart/lung ratio. Stress Images:  Normal homogeneous uptake in all areas of the myocardium. Rest Images:  Normal homogeneous uptake in all areas of the myocardium. Subtraction (SDS):  No evidence of ischemia. Transient Ischemic Dilatation (Normal <1.22):  0.73 Lung/Heart Ratio (Normal <0.45):  0.31  Quantitative Gated Spect Images QGS EDV:  86 ml QGS ESV:  25 ml  Impression Exercise Capacity:  There's limited exercise capacity. The patient had a marked hypertensive response. Study was stopped for fatigue and because of marked hypertension. 97% predicted maximum heart rate was obtained. BP Response:  Hypertensive blood pressure response. Clinical Symptoms:  Patient had leg fatigue. ECG Impression:  No significant ST segment change suggestive of ischemia. Comparison with Prior Nuclear Study: No images to compare  Overall Impression:  Normal stress nuclear study. There is no scar or ischemia. This is a low risk scan. The patient did have a significant hypertensive response to stress.  LV Ejection Fraction: 71%.  LV Wall Motion:  Normal Wall Motion.  Dola Argyle,  MD

## 2014-01-26 ENCOUNTER — Encounter: Payer: Self-pay | Admitting: Internal Medicine

## 2014-01-26 ENCOUNTER — Ambulatory Visit (HOSPITAL_COMMUNITY): Payer: 59 | Attending: Internal Medicine

## 2014-01-26 DIAGNOSIS — R0989 Other specified symptoms and signs involving the circulatory and respiratory systems: Secondary | ICD-10-CM

## 2014-01-26 MED ORDER — TECHNETIUM TC 99M SESTAMIBI GENERIC - CARDIOLITE
33.0000 | Freq: Once | INTRAVENOUS | Status: AC | PRN
  Administered 2014-01-26: 33 via INTRAVENOUS

## 2014-02-09 ENCOUNTER — Ambulatory Visit (INDEPENDENT_AMBULATORY_CARE_PROVIDER_SITE_OTHER): Payer: 59 | Admitting: Cardiology

## 2014-02-09 ENCOUNTER — Encounter: Payer: Self-pay | Admitting: Cardiology

## 2014-02-09 VITALS — BP 130/88 | HR 72 | Ht 64.0 in | Wt 265.0 lb

## 2014-02-09 DIAGNOSIS — I4891 Unspecified atrial fibrillation: Secondary | ICD-10-CM

## 2014-02-09 DIAGNOSIS — E785 Hyperlipidemia, unspecified: Secondary | ICD-10-CM

## 2014-02-09 DIAGNOSIS — R079 Chest pain, unspecified: Secondary | ICD-10-CM

## 2014-02-09 DIAGNOSIS — I10 Essential (primary) hypertension: Secondary | ICD-10-CM

## 2014-02-09 NOTE — Assessment & Plan Note (Signed)
Patient had one isolated episode of atrial fibrillation in 2010. She has not had any recurrent symptoms. Continue beta blocker. At time of previous evaluation Dr. Lovena Le recommended aspirin and if any recurrent episodes anticoagulation at that point. She has a CHADSvasc score of 3. We discussed options again today and she would prefer to continue aspirin. I will consider NOAC in the future if she has recurrent episodes.

## 2014-02-09 NOTE — Assessment & Plan Note (Signed)
Continue statin. 

## 2014-02-09 NOTE — Patient Instructions (Signed)
Your physician recommends that you continue on your current medications as directed. Please refer to the Current Medication list given to you today.   Your physician wants you to follow-up in: 1 year with Dr. Crenshaw You will receive a reminder letter in the mail two months in advance. If you don't receive a letter, please call our office to schedule the follow-up appointment.   

## 2014-02-09 NOTE — Assessment & Plan Note (Signed)
Blood pressure controlled. Continue present medications. 

## 2014-02-09 NOTE — Progress Notes (Signed)
HPI: FU chest pain. Patient seen previously by Dr. Lovena Le for paroxysmal atrial fibrillation, last in Oct 2010. Nuclear study in October 2010 showed an ejection fraction of 67% and normal perfusion. Echocardiogram in October of 2010 showed normal LV function. Nuclear study Feb 2015 showed EF 71 and normal perfusion. Since she was last seen, the patient has dyspnea with more extreme activities but not with routine activities. It is relieved with rest. It is not associated with chest pain. There is no orthopnea, PND or pedal edema. There is no syncope or palpitations. There is no exertional chest pain.    Current Outpatient Prescriptions  Medication Sig Dispense Refill  . amLODipine (NORVASC) 10 MG tablet Take 10 mg by mouth every evening.       Marland Kitchen aspirin 325 MG tablet Take 325 mg by mouth every morning.       . colesevelam (WELCHOL) 625 MG tablet Take 1,875 mg by mouth 2 (two) times daily with a meal.      . esomeprazole (NEXIUM) 40 MG capsule Take 40 mg by mouth every morning.       . fexofenadine (ALLEGRA) 180 MG tablet Take 180 mg by mouth every morning.      Marland Kitchen glipiZIDE (GLUCOTROL) 10 MG tablet Take 10 mg by mouth 2 (two) times daily before a meal.      . ibuprofen (ADVIL,MOTRIN) 800 MG tablet Take 800 mg by mouth every morning.       . INVOKAMET [phone] MG TABS Take 1 tablet by mouth 2 (two) times daily.       Marland Kitchen losartan (COZAAR) 100 MG tablet Take 50 mg by mouth 2 (two) times daily.      . metoprolol (LOPRESSOR) 50 MG tablet Take 50 mg by mouth 2 (two) times daily.      . Multiple Vitamin (MULTIVITAMIN) tablet Take 1 tablet by mouth daily.      . pravastatin (PRAVACHOL) 40 MG tablet Take 40 mg by mouth every morning.        No current facility-administered medications for this visit.     Past Medical History  Diagnosis Date  . Fibroid   . Ovarian cyst     Dermoid-Right   Serous cystadenoma-Left  . Hydrosalpinx     Right  . Acid reflux   . Diabetes mellitus   .  Hypertension   . Elevated cholesterol   . Atrial fibrillation   . Migraine   . Sleep apnea with use of continuous positive airway pressure (CPAP)      02-19-13 , AHI 59.8 , titration to 10 cm water.     Past Surgical History  Procedure Laterality Date  . Vaginal hysterectomy  1992    partial  . Myomectomy      Multiple  . Abdominal surgery      Expl. Laparotomy  . Salpingectomy      Right  . Knee surgery Right 2004-2005  . Oophorectomy      LSO  . Knee arthroscopy Left 08/20/12    torn meniscus  . Kenalog injection    . Ganglion cyst excision Right [id number]    wrist  . Myomectomy  [id number]  . Tonsillectomy  1965  . Knee surgery, arthroscopic       sept 2013 , left knee     History   Social History  . Marital Status: Married    Spouse Name: N/A    Number of Children: 3  . Years of  Education: 12   Occupational History  .  [employer]   Social History Main Topics  . Smoking status: Former Smoker    Quit date: 11/25/1995  . Smokeless tobacco: Not on file  . Alcohol Use: No  . Drug Use: No  . Sexual Activity: No   Other Topics Concern  . Not on file   Social History Narrative  . No narrative on file    ROS: no fevers or chills, productive cough, hemoptysis, dysphasia, odynophagia, melena, hematochezia, dysuria, hematuria, rash, seizure activity, orthopnea, PND, pedal edema, claudication. Remaining systems are negative.  Physical Exam: Well-developed obese in no acute distress.  Skin is warm and dry.  HEENT is normal.  Neck is supple.  Chest is clear to auscultation with normal expansion.  Cardiovascular exam is regular rate and rhythm.  Abdominal exam nontender or distended. No masses palpated. Extremities show no edema. neuro grossly intact

## 2014-02-09 NOTE — Assessment & Plan Note (Signed)
No further symptoms. Nuclear study unremarkable. Continue risk factor modification.

## 2014-03-31 ENCOUNTER — Other Ambulatory Visit: Payer: Self-pay | Admitting: Internal Medicine

## 2014-03-31 DIAGNOSIS — Z1231 Encounter for screening mammogram for malignant neoplasm of breast: Secondary | ICD-10-CM

## 2014-05-18 ENCOUNTER — Ambulatory Visit: Payer: 59

## 2014-09-26 ENCOUNTER — Encounter: Payer: Self-pay | Admitting: Cardiology

## 2014-10-04 ENCOUNTER — Encounter (INDEPENDENT_AMBULATORY_CARE_PROVIDER_SITE_OTHER): Payer: Self-pay

## 2014-10-04 ENCOUNTER — Ambulatory Visit
Admission: RE | Admit: 2014-10-04 | Discharge: 2014-10-04 | Disposition: A | Discharge status: Home / Self Care | Payer: 59 | Source: Ambulatory Visit | Attending: Internal Medicine | Admitting: Internal Medicine

## 2014-10-04 DIAGNOSIS — Z1231 Encounter for screening mammogram for malignant neoplasm of breast: Secondary | ICD-10-CM

## 2014-10-05 ENCOUNTER — Other Ambulatory Visit: Payer: Self-pay | Admitting: Internal Medicine

## 2014-10-05 DIAGNOSIS — R928 Other abnormal and inconclusive findings on diagnostic imaging of breast: Secondary | ICD-10-CM

## 2014-10-14 ENCOUNTER — Ambulatory Visit
Admission: RE | Admit: 2014-10-14 | Discharge: 2014-10-14 | Disposition: A | Discharge status: Home / Self Care | Payer: 59 | Source: Ambulatory Visit | Attending: Internal Medicine | Admitting: Internal Medicine

## 2014-10-14 DIAGNOSIS — R928 Other abnormal and inconclusive findings on diagnostic imaging of breast: Secondary | ICD-10-CM

## 2014-11-03 ENCOUNTER — Encounter: Payer: Self-pay | Admitting: Neurology

## 2014-11-03 ENCOUNTER — Ambulatory Visit (INDEPENDENT_AMBULATORY_CARE_PROVIDER_SITE_OTHER): Payer: 59 | Admitting: Neurology

## 2014-11-03 VITALS — BP 140/68 | HR 68 | Resp 16 | Ht 64.5 in | Wt 265.0 lb

## 2014-11-03 DIAGNOSIS — E662 Morbid (severe) obesity with alveolar hypoventilation: Secondary | ICD-10-CM

## 2014-11-03 DIAGNOSIS — G4733 Obstructive sleep apnea (adult) (pediatric): Secondary | ICD-10-CM

## 2014-11-03 DIAGNOSIS — E1140 Type 2 diabetes mellitus with diabetic neuropathy, unspecified: Secondary | ICD-10-CM

## 2014-11-03 DIAGNOSIS — Z9989 Dependence on other enabling machines and devices: Secondary | ICD-10-CM

## 2014-11-03 NOTE — Progress Notes (Signed)
[facility]  Provider:  Larey Seat, M D  Referring Provider: Tivis Ringer, MD Primary Care Physician:  Tivis Ringer, MD  Chief Complaint  Patient presents with  . RV sleep /cpap    Rm 10, alone    HPI:  Mary Sharp is a 53 y.o. female  Is seen here ain a yearly  revisit  from Dr. Dagmar Hait for CPAP compliance in the treatment of OSA.   Interval history:  No longer needing a cane, after having a 'shot of steroid " into the left knee. Partakes In water aerobics. Plans to start Yoga. Diabetic neuropathy has progressed , see exam. Weight reduced, by 12 the end of this period was 09-02-14 she also has a 98 percentile compliance for use over 4 hours, her average usage of 7 hours and 6 minutes with a pressure is 10 cm water with 2 cm EPR and her residual AHI is 0.5. The needs to be no adjustments made to the airleak is mild-to-moderate and the AHI is highly desirable.    Last visit :  The download last dated 09-02-2013 a residual AHI at the time is 0.4, an excellent result. The machine is still set at 10 cm water pressure was 2 cm EPR. Her daytime and therapy on average of 6 hours and 40 two-minute the patient has 100% compliant spiciness criteria. Her first download seen 6 months ago actually revealed a residual AHI that was just slightly higher at 0.7 apneas per hour. The patient had noticed a decrease in her blood sugar levels in the last minute and an improvement in her HbA1c. More recently she was changed from metformin to a new  medication  'Invokamed",  creating some glycosuria, and thereby nocturia. The patient also reports that she still has some mental poles all symptoms that may affect the quality of her rest at night, she averages 3 bathroom breaks at night. In office download today from 11/02/2013 and composed of  100 days, showed   98%  compliance and a residual AHI of 0.4 average daytime and therapy of 6 hours and 45 minutes. Excellent results. slep  habits  Have other wise not changed , Bedtime 9 PM, overall 7 hours of nightly sleep,  week days and week ends are similar,  Rises at 5 AM , with an alarm. Caffeine in the morning.    Review of Systems: Out of a complete 14 system review, the patient complains of only the following symptoms, and all other reviewed systems are negative. Epworth  9, FSS 21, GDS 1 .     History   Social History  . Marital Status: Married    Spouse Name: N/A    Number of Children: 3  . Years of Education: 12   Occupational History  .  [employer]   Social History Main Topics  . Smoking status: Former Smoker    Quit date: 11/25/1995  . Smokeless tobacco: Not on file  . Alcohol Use: No  . Drug Use: No  . Sexual Activity: No   Other Topics Concern  . Not on file   Social History Narrative   Right handed.  Caffeine 2 cups day.  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  Married, 2 bio kids, 1 step    Family History  Problem Relation Age of Onset  . Breast cancer Mother     Age 45  . Heart disease Brother   . Coronary artery disease Brother  stent placement  . Hypertension Brother   . Crohn's disease Brother     Past Medical History  Diagnosis Date  . Fibroid   . Ovarian cyst     Dermoid-Right   Serous cystadenoma-Left  . Hydrosalpinx     Right  . Acid reflux   . Diabetes mellitus   . Hypertension   . Elevated cholesterol   . Atrial fibrillation   . Migraine   . Sleep apnea with use of continuous positive airway pressure (CPAP)      02-19-13 , AHI 59.8 , titration to 10 cm water.   . Chest pain     Past Surgical History  Procedure Laterality Date  . Vaginal hysterectomy  1992    partial  . Myomectomy      Multiple  . Abdominal surgery      Expl. Laparotomy  . Salpingectomy      Right  . Knee surgery Right 2004-2005  . Oophorectomy      LSO  . Knee arthroscopy Left 08/20/12    torn meniscus  . Kenalog injection    . Ganglion cyst excision Right [id number]    wrist   . Myomectomy  [id number]  . Tonsillectomy  1965  . Knee surgery, arthroscopic       sept 2013 , left knee     Current Outpatient Prescriptions  Medication Sig Dispense Refill  . amLODipine (NORVASC) 10 MG tablet Take 10 mg by mouth every evening.     Marland Kitchen aspirin 325 MG tablet Take 325 mg by mouth every morning.     . colesevelam (WELCHOL) 625 MG tablet Take 1,875 mg by mouth 2 (two) times daily with a meal.    . esomeprazole (NEXIUM) 40 MG capsule Take 40 mg by mouth every morning.     . fexofenadine (ALLEGRA) 180 MG tablet Take 180 mg by mouth every morning.    Marland Kitchen glipiZIDE (GLUCOTROL) 10 MG tablet Take 10 mg by mouth 2 (two) times daily before a meal.    . ibuprofen (ADVIL,MOTRIN) 800 MG tablet Take 800 mg by mouth every morning.     . INVOKAMET [phone] MG TABS Take 1 tablet by mouth 2 (two) times daily.     Marland Kitchen losartan (COZAAR) 100 MG tablet Take 50 mg by mouth 2 (two) times daily.    . metoprolol (LOPRESSOR) 50 MG tablet Take 50 mg by mouth 2 (two) times daily.    . Multiple Vitamin (MULTIVITAMIN) tablet Take 1 tablet by mouth daily.    . pravastatin (PRAVACHOL) 40 MG tablet Take 40 mg by mouth every morning.     . meloxicam (MOBIC) 15 MG tablet Take 15 mg by mouth daily.  3   No current facility-administered medications for this visit.    Allergies as of 11/03/2014 - Review Complete 11/03/2014  Allergen Reaction Noted  . Codeine Nausea And Vomiting   . Hydrocodone-acetaminophen Nausea Only   . Propoxyphene n-acetaminophen    . Morphine Nausea Only and Rash     Vitals: BP 140/68 mmHg  Pulse 68  Resp 16  Ht 5' 4.5" (1.638 m)  Wt 265 lb (120.203 kg)  BMI 44.80 kg/m2 Last Weight:  Wt Readings from Last 1 Encounters:  11/03/14 265 lb (120.203 kg)   Last Height:   Ht Readings from Last 1 Encounters:  11/03/14 5' 4.5" (1.638 m)   Physical exam:  General: The patient is awake, alert and appears not in acute distress.  The patient is well groomed.  Head: Normocephalic,  atraumatic.  Neck is supple. Mallampati 2  , neck circumference: 15.5  inches.  Double chin with  Retrognathia.  Chin strap no longer used with nasal mask : no pressure marks.   status post tonsillectomy , adenoidectomy at age 45.  Cardiovascular:  Regular rate and rhythm  without murmurs or carotid bruit, and without distended neck veins. Respiratory: Lungs are clear to auscultation. Skin:  Without evidence of edema, or rash Trunk: BMI 44.8, still elevated .  Neurologic exam : The patient is awake and alert, oriented to place and time. Memory subjective described as intact. There is a normal attention span & concentration ability.  Speech is fluent without  dysarthria, dysphonia or aphasia.  Mood and affect are appropriate. Patient noted improved ability to focus, weekend before last she had energy to work in garden and house.   Cranial nerves: Pupils are equal and briskly reactive to light. Hearing to finger rub intact. Facial sensation intact to fine touch.  Facial motor strength is symmetric and tongue and uvula move midline.  Motor exam: Normal tone and normal muscle bulk . Left knee pain and swelling, restricted ROM.  Left knee crepitus - patient now walks with 4 prod cane.   Sensory:  Fine touch, pinprick and vibration were reduced in her finger tips , attributed to carpal tunnel bilaterally.  she noted ankle level numbness to fine touch.  Proprioception is tested in the upper extremities-  Normal.   Coordination: Rapid alternating movements in the fingers/hands /Finger-to-nose maneuver tested and normal without evidence of ataxia, dysmetria or tremor.  Gait and station: Patient walks without any  assistive device . DTR attenuated.     Assessment / Plan:    After physical and neurologic examination, review of laboratory studies, imaging, neurophysiology testing and pre-existing records, assessment is     1) OSA related to high BMI and retrognathia, affecting DM and HTN ,  Nocturia.   well controlled on CPAP, AHI below 1, and highly compliant.   2)  Obesity : weight loss has been achieved , about 15 pounds- and she is enrolled in water fitness classes.  Weight watchers member , once lost 72 pounds. Stress eater, may benefit from counseling.  She  was obese all her life, and gained weight after each pregnancy .  3) diabetes under new medication. She developed neuropathy and  bilateral carpal tunnel syndrome, attributed to diabetes.   Dr Dagmar Hait -   Plan for the patient is to continue her current regimen on CPAP-  CPAP at the current setting of 10 cm water.  She is followed by [place]. She has done extremely well and is 98% compliant with CMS standards. Further weight reduction will affect all her health problems positively . She is less sleepy ,not in pain and much more motivated to exercise, has one nocturia.

## 2015-10-17 ENCOUNTER — Telehealth: Payer: Self-pay | Admitting: Cardiology

## 2015-10-17 NOTE — Telephone Encounter (Signed)
Received records from [facility] for appointment on 11/06/15 with Dr Stanford Breed.  Records given to [facility] (medical records) for Dr Jacalyn Lefevre schedule on 11/06/15. lp

## 2015-11-06 ENCOUNTER — Encounter: Payer: Self-pay | Admitting: Neurology

## 2015-11-06 ENCOUNTER — Ambulatory Visit: Payer: 59 | Admitting: Cardiology

## 2015-11-06 ENCOUNTER — Ambulatory Visit (INDEPENDENT_AMBULATORY_CARE_PROVIDER_SITE_OTHER): Payer: 59 | Admitting: Neurology

## 2015-11-06 VITALS — BP 132/72 | HR 80 | Resp 20 | Ht 64.5 in | Wt 247.0 lb

## 2015-11-06 DIAGNOSIS — Z9989 Dependence on other enabling machines and devices: Secondary | ICD-10-CM

## 2015-11-06 DIAGNOSIS — E1110 Type 2 diabetes mellitus with ketoacidosis without coma: Secondary | ICD-10-CM

## 2015-11-06 DIAGNOSIS — E1310 Other specified diabetes mellitus with ketoacidosis without coma: Secondary | ICD-10-CM | POA: Diagnosis not present

## 2015-11-06 DIAGNOSIS — G4733 Obstructive sleep apnea (adult) (pediatric): Secondary | ICD-10-CM

## 2015-11-06 NOTE — Patient Instructions (Signed)

## 2015-11-06 NOTE — Progress Notes (Signed)
[facility]  Provider:  Larey Seat, M D  Referring Provider: Prince Solian, MD Primary Care Physician:  Tivis Ringer, MD  Chief Complaint  Patient presents with  . Follow-up    osa on cpap, going well, rm 10, alone    HPI:  Mary Sharp is a 54 y.o. female  Is seen here ain a yearly revisit  from Dr. Dagmar Hait for CPAP compliance in the treatment of OSA.   Interval history:  11-06-15 ;No longer needing a cane, after having a 'shot of steroid " into the left knee. Partakes In water aerobics. Plans to start Yoga. She is feeling well and is mobile.  He had recent changes in her medication regimens including getting off glipizide and started on an injectable medication under the guidance of a pharmacologist. She developed nausea and a feeling of increasing sudden fatigue after the injection. She returned back to glipizide.   As to her CPAP use she has been 100% compliant user average user time is 7 hours 26 minutes, setting is 10 cm water with 2 cm EPR and the residual AHI is 0.4 which is excellent she has occasional air leaks but these are also dependent on how fresh her mask/ interface is.  Changed 10 days ago, documented lower air leaks following this.      Past visit notes.  The download last dated 09-02-2013 a residual AHI at the time is 0.4, an excellent result. The machine is still set at 10 cm water pressure was 2 cm EPR. Her daytime and therapy on average of 6 hours and 40 two-minute the patient has 100% compliant spiciness criteria. Her first download seen 6 months ago actually revealed a residual AHI that was just slightly higher at 0.7 apneas per hour. The patient had noticed a decrease in her blood sugar levels in the last minute and an improvement in her HbA1c. More recently she was changed from metformin to a new  medication  'Invokamed", creating some glycosuria, and thereby nocturia. The patient also reports that she still has some mental poles all  symptoms that may affect the quality of her rest at night, she averages 3 bathroom breaks at night. Office download  from 11/02/2013 and composed of 100 days, showed  98%  compliance and a residual AHI of 0.4 average daytime and therapy of 6 hours and 45 minutes. Excellent results. sleep habits not changed , Bedtime 9 PM, overall 7 hours of nightly sleep, week days and week ends are similar,  Rises at 5 AM , with an alarm. Caffeine in the morning.   09-02-14 she also has a 98 percentile  CPAP compliance for use over 4 hours, her average usage of 7 hours and 6 minutes with a pressure is 10 cm water with 2 cm EPR and her residual AHI is 0.5. The needs to be no adjustments made to the airleak is mild-to-moderate and the AHI is highly desirable.    Review of Systems: Out of a complete 14 system review, the patient complains of only the following symptoms, and all other reviewed systems are negative. Epworth  4 from 9 , FSS 14 from 21, GDS  Remains at 1 point.  11-06-15     Social History   Social History  . Marital Status: Married    Spouse Name: N/A  . Number of Children: 3  . Years of Education: 12   Occupational History  .  [employer]   Social History Main Topics  .  Smoking status: Former Smoker    Quit date: 11/25/1995  . Smokeless tobacco: Not on file  . Alcohol Use: No  . Drug Use: No  . Sexual Activity: No   Other Topics Concern  . Not on file   Social History Narrative   Right handed.  Caffeine 2 cups day.  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  Married, 2 bio kids, 1 step    Family History  Problem Relation Age of Onset  . Breast cancer Mother     Age 50  . Heart disease Brother   . Coronary artery disease Brother     stent placement  . Hypertension Brother   . Crohn's disease Brother     Past Medical History  Diagnosis Date  . Fibroid   . Ovarian cyst     Dermoid-Right   Serous cystadenoma-Left  . Hydrosalpinx     Right  . Acid reflux   . Diabetes  mellitus   . Hypertension   . Elevated cholesterol   . Atrial fibrillation ([place])   . Migraine   . Sleep apnea with use of continuous positive airway pressure (CPAP)      02-19-13 , AHI 59.8 , titration to 10 cm water.   . Chest pain     Past Surgical History  Procedure Laterality Date  . Vaginal hysterectomy  1992    partial  . Myomectomy      Multiple  . Abdominal surgery      Expl. Laparotomy  . Salpingectomy      Right  . Knee surgery Right 2004-2005  . Oophorectomy      LSO  . Knee arthroscopy Left 08/20/12    torn meniscus  . Kenalog injection    . Ganglion cyst excision Right [id number]    wrist  . Myomectomy  [id number]  . Tonsillectomy  1965  . Knee surgery, arthroscopic       sept 2013 , left knee     Current Outpatient Prescriptions  Medication Sig Dispense Refill  . amLODipine (NORVASC) 10 MG tablet Take 10 mg by mouth every evening.     Marland Kitchen aspirin 325 MG tablet Take 325 mg by mouth every morning.     . cephALEXin (KEFLEX) 250 MG capsule Take 500 mg by mouth 3 (three) times daily.    . colesevelam (WELCHOL) 625 MG tablet Take 1,875 mg by mouth 2 (two) times daily with a meal.    . esomeprazole (NEXIUM) 40 MG capsule Take 40 mg by mouth every morning.     . fexofenadine (ALLEGRA) 180 MG tablet Take 180 mg by mouth every morning.    Marland Kitchen glipiZIDE (GLUCOTROL) 10 MG tablet Take 10 mg by mouth 2 (two) times daily before a meal.    . ibuprofen (ADVIL,MOTRIN) 600 MG tablet Take 600 mg by mouth every 6 (six) hours as needed.    . INVOKAMET [phone] MG TABS Take 1 tablet by mouth 2 (two) times daily.     Marland Kitchen losartan (COZAAR) 100 MG tablet Take 50 mg by mouth 2 (two) times daily.    . metoprolol (LOPRESSOR) 50 MG tablet Take 50 mg by mouth 2 (two) times daily.    . Multiple Vitamin (MULTIVITAMIN) tablet Take 1 tablet by mouth daily.    . pravastatin (PRAVACHOL) 40 MG tablet Take 40 mg by mouth every morning.      No current facility-administered medications for this visit.     Allergies as of 11/06/2015 - Review Complete  11/06/2015  Allergen Reaction Noted  . Codeine Nausea And Vomiting   . Hydrocodone-acetaminophen Nausea Only   . Propoxyphene n-acetaminophen    . Morphine Nausea Only and Rash     Vitals: BP 132/72 mmHg  Pulse 80  Resp 20  Ht 5' 4.5" (1.638 m)  Wt 247 lb (112.038 kg)  BMI 41.76 kg/m2 Last Weight:  Wt Readings from Last 1 Encounters:  11/06/15 247 lb (112.038 kg)   Last Height:   Ht Readings from Last 1 Encounters:  11/06/15 5' 4.5" (1.638 m)   Physical exam:  General: The patient is awake, alert and appears not in acute distress.  The patient is well groomed. Head: Normocephalic, atraumatic.  Neck is supple. Mallampati 2  , neck circumference: 15.5  inches.  Double chin with  Retrognathia.  Chin strap no longer used with nasal mask : no pressure marks.   status post tonsillectomy , adenoidectomy at age 60.  Cardiovascular:  Regular rate and rhythm without murmurs or carotid bruit, and without distended neck veins. Respiratory: Lungs are clear to auscultation. Skin:  Without evidence of edema, or rash Trunk: BMI 44.8, still elevated .  Neurologic exam : The patient is awake and alert, oriented to place and time.  Memory subjective described as intact. There is a normal attention span & concentration ability.  Speech is fluent without dysarthria, dysphonia or aphasia.  Mood and affect are appropriate.  Patient noted improved ability to focus, weekend before last she had energy to work in garden and house.   Cranial nerves: Pupils are equal and briskly reactive to light. Hearing to finger rub intact. Facial sensation intact to fine touch.  Facial motor strength is symmetric and tongue and uvula move midline.  Gait and station: Patient walks without any  assistive device . DTR attenuated.     Assessment / Plan:    After physical and neurologic examination, review of laboratory studies, imaging, neurophysiology testing  and pre-existing records,  15 minute compliance assessment  With more than 50% of the face to face time dedicated to the discussion of her treatment options and to keep up on the diabetes regimen :   1) OSA related to high BMI and retrognathia, affecting DM and HTN , Nocturia.   well controlled on CPAP, AHI below 1,  ( 0.4) and highly compliant- 100%  - 11-06-15 .   2) Obesity : weight loss has been achieved , about 15 pounds- and she is enrolled in water fitness classes.  Weight watchers member , once lost 72 pounds. Stress eater, may benefit from counseling.  She  was obese all her life, and gained weight after each pregnancy .  3) diabetes under new medication.  She developed neuropathy and  bilateral carpal tunnel syndrome, attributed to diabetes.    Dr Dagmar Hait follows.   Plan for the patient is to continue her current regimen on CPAP-  CPAP at the current setting of 10 cm water.  She is followed by [place].    Dio Giller, MD  RV in 21 month

## 2015-11-30 ENCOUNTER — Telehealth: Payer: Self-pay | Admitting: Cardiology

## 2015-12-01 NOTE — Telephone Encounter (Signed)
Close encounter 

## 2015-12-01 NOTE — Progress Notes (Signed)
HPI: FU chest pain. Patient seen previously by Dr. Lovena Le for paroxysmal atrial fibrillation, last in Oct 2010. Nuclear study in October 2010 showed an ejection fraction of 67% and normal perfusion. Echocardiogram in October of 2010 showed normal LV function. Nuclear study Feb 2015 showed EF 71 and normal perfusion. Since she was last seen,   Current Outpatient Prescriptions  Medication Sig Dispense Refill  . amLODipine (NORVASC) 10 MG tablet Take 10 mg by mouth every evening.     Marland Kitchen aspirin 325 MG tablet Take 325 mg by mouth every morning.     . cephALEXin (KEFLEX) 250 MG capsule Take 500 mg by mouth 3 (three) times daily.    . colesevelam (WELCHOL) 625 MG tablet Take 1,875 mg by mouth 2 (two) times daily with a meal.    . esomeprazole (NEXIUM) 40 MG capsule Take 40 mg by mouth every morning.     . fexofenadine (ALLEGRA) 180 MG tablet Take 180 mg by mouth every morning.    Marland Kitchen glipiZIDE (GLUCOTROL) 10 MG tablet Take 10 mg by mouth 2 (two) times daily before a meal.    . ibuprofen (ADVIL,MOTRIN) 600 MG tablet Take 600 mg by mouth every 6 (six) hours as needed.    . INVOKAMET [phone] MG TABS Take 1 tablet by mouth 2 (two) times daily.     Marland Kitchen losartan (COZAAR) 100 MG tablet Take 50 mg by mouth 2 (two) times daily.    . metoprolol (LOPRESSOR) 50 MG tablet Take 50 mg by mouth 2 (two) times daily.    . Multiple Vitamin (MULTIVITAMIN) tablet Take 1 tablet by mouth daily.    . pravastatin (PRAVACHOL) 40 MG tablet Take 40 mg by mouth every morning.      No current facility-administered medications for this visit.     Past Medical History  Diagnosis Date  . Fibroid   . Ovarian cyst     Dermoid-Right   Serous cystadenoma-Left  . Hydrosalpinx     Right  . Acid reflux   . Diabetes mellitus   . Hypertension   . Elevated cholesterol   . Atrial fibrillation ([place])   . Migraine   . Sleep apnea with use of continuous positive airway pressure (CPAP)      02-19-13 , AHI 59.8 , titration to 10  cm water.   . Chest pain     Past Surgical History  Procedure Laterality Date  . Vaginal hysterectomy  1992    partial  . Myomectomy      Multiple  . Abdominal surgery      Expl. Laparotomy  . Salpingectomy      Right  . Knee surgery Right 2004-2005  . Oophorectomy      LSO  . Knee arthroscopy Left 08/20/12    torn meniscus  . Kenalog injection    . Ganglion cyst excision Right [id number]    wrist  . Myomectomy  [id number]  . Tonsillectomy  1965  . Knee surgery, arthroscopic       sept 2013 , left knee     Social History   Social History  . Marital Status: Married    Spouse Name: N/A  . Number of Children: 3  . Years of Education: 12   Occupational History  .  [employer]   Social History Main Topics  . Smoking status: Former Smoker    Quit date: 11/25/1995  . Smokeless tobacco: Not on file  . Alcohol Use: No  .  Drug Use: No  . Sexual Activity: No   Other Topics Concern  . Not on file   Social History Narrative   Right handed.  Caffeine 2 cups day.  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  Married, 2 bio kids, 1 step    ROS: no fevers or chills, productive cough, hemoptysis, dysphasia, odynophagia, melena, hematochezia, dysuria, hematuria, rash, seizure activity, orthopnea, PND, pedal edema, claudication. Remaining systems are negative.  Physical Exam: Well-developed well-nourished in no acute distress.  Skin is warm and dry.  HEENT is normal.  Neck is supple.  Chest is clear to auscultation with normal expansion.  Cardiovascular exam is regular rate and rhythm.  Abdominal exam nontender or distended. No masses palpated. Extremities show no edema. neuro grossly intact  ECG     This encounter was created in error - please disregard.

## 2015-12-04 ENCOUNTER — Encounter: Payer: 59 | Admitting: Cardiology

## 2015-12-22 NOTE — Progress Notes (Signed)
HPI: FU chest pain. Patient seen previously by Dr. Lovena Le for isolated episode of paroxysmal atrial fibrillation in Oct 2010. Nuclear study in October 2010 showed an ejection fraction of 67% and normal perfusion. Echocardiogram in October of 2010 showed normal LV function. Nuclear study Feb 2015 showed EF 71 and normal perfusion. Since she was last seen, She has mild dyspnea with extreme activities. No orthopnea, PND, pedal edema, exertional chest pain or syncope. Earlier today she had 3 brief dizzy spells but no syncope. No associated palpitations. She then had mild discomfort in her left upper extremity and numbness for 15 seconds. No other neurological symptoms.  Current Outpatient Prescriptions  Medication Sig Dispense Refill  . amLODipine (NORVASC) 10 MG tablet Take 10 mg by mouth every evening.     Marland Kitchen aspirin 325 MG tablet Take 325 mg by mouth every morning.     . colesevelam (WELCHOL) 625 MG tablet Take 1,875 mg by mouth 2 (two) times daily with a meal.    . dextromethorphan-guaiFENesin (MUCINEX DM) 30-600 MG 12hr tablet Take 1 tablet by mouth 2 (two) times daily.    Marland Kitchen esomeprazole (NEXIUM) 40 MG capsule Take 40 mg by mouth every morning.     . fexofenadine (ALLEGRA) 180 MG tablet Take 180 mg by mouth every morning.    Marland Kitchen glipiZIDE (GLUCOTROL) 10 MG tablet Take 10 mg by mouth 2 (two) times daily before a meal.    . ibuprofen (ADVIL,MOTRIN) 600 MG tablet Take 600 mg by mouth every 6 (six) hours as needed.    . INVOKAMET [phone] MG TABS Take 1 tablet by mouth 2 (two) times daily.     Marland Kitchen losartan (COZAAR) 100 MG tablet Take 50 mg by mouth 2 (two) times daily.    . metoprolol (LOPRESSOR) 50 MG tablet Take 50 mg by mouth 2 (two) times daily.    . Multiple Vitamin (MULTIVITAMIN) tablet Take 1 tablet by mouth daily.    . pravastatin (PRAVACHOL) 40 MG tablet Take 40 mg by mouth every morning.      No current facility-administered medications for this visit.     Past Medical History    Diagnosis Date  . Fibroid   . Ovarian cyst     Dermoid-Right   Serous cystadenoma-Left  . Hydrosalpinx     Right  . Acid reflux   . Diabetes mellitus   . Hypertension   . Elevated cholesterol   . Atrial fibrillation ([place])   . Migraine   . Sleep apnea with use of continuous positive airway pressure (CPAP)      02-19-13 , AHI 59.8 , titration to 10 cm water.   . Chest pain     Past Surgical History  Procedure Laterality Date  . Vaginal hysterectomy  1992    partial  . Myomectomy      Multiple  . Abdominal surgery      Expl. Laparotomy  . Salpingectomy      Right  . Knee surgery Right 2004-2005  . Oophorectomy      LSO  . Knee arthroscopy Left 08/20/12    torn meniscus  . Kenalog injection    . Ganglion cyst excision Right [id number]    wrist  . Myomectomy  [id number]  . Tonsillectomy  1965  . Knee surgery, arthroscopic       sept 2013 , left knee     Social History   Social History  . Marital Status: Married    Spouse Name:  N/A  . Number of Children: 3  . Years of Education: 12   Occupational History  .  [employer]   Social History Main Topics  . Smoking status: Former Smoker    Quit date: 11/25/1995  . Smokeless tobacco: Not on file  . Alcohol Use: No  . Drug Use: No  . Sexual Activity: No   Other Topics Concern  . Not on file   Social History Narrative   Right handed.  Caffeine 2 cups day.  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  Married, 2 bio kids, 1 step    Family History  Problem Relation Age of Onset  . Breast cancer Mother     Age 25  . Heart disease Brother   . Coronary artery disease Brother     stent placement  . Hypertension Brother   . Crohn's disease Brother     ROS: no fevers or chills, productive cough, hemoptysis, dysphasia, odynophagia, melena, hematochezia, dysuria, hematuria, rash, seizure activity, orthopnea, PND, pedal edema, claudication. Remaining systems are negative.  Physical Exam: Well-developed obese in no  acute distress.  Skin is warm and dry.  HEENT is normal.  Neck is supple.  Chest is clear to auscultation with normal expansion.  Cardiovascular exam is regular rate and rhythm.  Abdominal exam nontender or distended. No masses palpated. Extremities show no edema. neuro grossly intact  ECG Sinus bradycardia, no ST changes

## 2015-12-29 ENCOUNTER — Ambulatory Visit (INDEPENDENT_AMBULATORY_CARE_PROVIDER_SITE_OTHER): Payer: 59 | Admitting: Cardiology

## 2015-12-29 ENCOUNTER — Encounter: Payer: Self-pay | Admitting: Cardiology

## 2015-12-29 VITALS — BP 140/82 | HR 59 | Ht 64.5 in | Wt 255.0 lb

## 2015-12-29 DIAGNOSIS — R0789 Other chest pain: Secondary | ICD-10-CM | POA: Diagnosis not present

## 2015-12-29 DIAGNOSIS — I4891 Unspecified atrial fibrillation: Secondary | ICD-10-CM

## 2015-12-29 NOTE — Assessment & Plan Note (Signed)
Continue statin. 

## 2015-12-29 NOTE — Assessment & Plan Note (Addendum)
Patient had one isolated episode of atrial fibrillation in 2010. She has not had any recurrent symptoms. Continue beta blocker. At time of previous evaluation Dr. Lovena Le recommended aspirin and if any recurrent episodes anticoagulation at that point. She has a CHADSvasc score of 3. We discussed options again today and she would prefer to continue aspirin; she understands higher risk of CVA. I will consider NOAC in the future if she has recurrent episodes.

## 2015-12-29 NOTE — Assessment & Plan Note (Signed)
Patient had brief arm pain earlier for 15 seconds.Her symptoms do not sound cardiac based on history. Her electorocardiogram shows no ST changes. We will not pursue further cardiac workup unless she has recurrent symptoms in the future.

## 2015-12-29 NOTE — Assessment & Plan Note (Signed)
BP controlled, continue present meds

## 2015-12-29 NOTE — Patient Instructions (Signed)
Your physician wants you to follow-up in: ONE YEAR WITH DR CRENSHAW You will receive a reminder letter in the mail two months in advance. If you don't receive a letter, please call our office to schedule the follow-up appointment.   If you need a refill on your cardiac medications before your next appointment, please call your pharmacy.  

## 2016-11-05 ENCOUNTER — Ambulatory Visit: Payer: 59 | Admitting: Neurology

## 2016-11-26 DIAGNOSIS — J069 Acute upper respiratory infection, unspecified: Secondary | ICD-10-CM | POA: Diagnosis not present

## 2016-11-27 DIAGNOSIS — Z780 Asymptomatic menopausal state: Secondary | ICD-10-CM | POA: Diagnosis not present

## 2017-01-15 DIAGNOSIS — H25013 Cortical age-related cataract, bilateral: Secondary | ICD-10-CM | POA: Diagnosis not present

## 2017-01-15 DIAGNOSIS — H40023 Open angle with borderline findings, high risk, bilateral: Secondary | ICD-10-CM | POA: Diagnosis not present

## 2017-01-15 DIAGNOSIS — E119 Type 2 diabetes mellitus without complications: Secondary | ICD-10-CM | POA: Diagnosis not present

## 2017-01-15 DIAGNOSIS — H35033 Hypertensive retinopathy, bilateral: Secondary | ICD-10-CM | POA: Diagnosis not present

## 2017-01-23 NOTE — Progress Notes (Signed)
HPI: FU chest pain. Patient seen previously by Dr. Lovena Le for isolated episode of paroxysmal atrial fibrillation in Oct 2010. Echocardiogram in October of 2010 showed normal LV function. Nuclear study Feb 2015 showed EF 71 and normal perfusion. Since she was last seen, there is no dyspnea, chest pain palpitations or syncope. She has had several episodes of dizziness. They occur with turning and last approximately 15 seconds. No syncope.  Current Outpatient Prescriptions  Medication Sig Dispense Refill  . amLODipine (NORVASC) 10 MG tablet Take 10 mg by mouth every evening.     Marland Kitchen aspirin 325 MG tablet Take 325 mg by mouth every morning.     . Empagliflozin-Metformin HCl (SYNJARDY) 12.03-999 MG TABS Take 1 tablet by mouth 2 (two) times daily.    Marland Kitchen esomeprazole (NEXIUM) 40 MG capsule Take 40 mg by mouth every morning.     . fexofenadine (ALLEGRA) 180 MG tablet Take 180 mg by mouth every morning.    Marland Kitchen glipiZIDE (GLUCOTROL) 10 MG tablet Take 10 mg by mouth 2 (two) times daily before a meal.    . ibuprofen (ADVIL,MOTRIN) 600 MG tablet Take 600 mg by mouth every 6 (six) hours as needed.    Marland Kitchen losartan (COZAAR) 100 MG tablet Take 50 mg by mouth 2 (two) times daily.    . metoprolol (LOPRESSOR) 50 MG tablet Take 50 mg by mouth 2 (two) times daily.    . Multiple Vitamin (MULTIVITAMIN) tablet Take 1 tablet by mouth daily.    . pravastatin (PRAVACHOL) 40 MG tablet Take 40 mg by mouth every morning.      No current facility-administered medications for this visit.      Past Medical History:  Diagnosis Date  . Acid reflux   . Atrial fibrillation ([place])   . Chest pain   . Diabetes mellitus   . Elevated cholesterol   . Fibroid   . Hydrosalpinx    Right  . Hypertension   . Migraine   . Ovarian cyst    Dermoid-Right   Serous cystadenoma-Left  . Sleep apnea with use of continuous positive airway pressure (CPAP)     02-19-13 , AHI 59.8 , titration to 10 cm water.     Past Surgical History:    Procedure Laterality Date  . ABDOMINAL SURGERY     Expl. Laparotomy  . GANGLION CYST EXCISION Right [id number]   wrist  . KENALOG INJECTION    . KNEE ARTHROSCOPY Left 08/20/12   torn meniscus  . KNEE SURGERY Right 2004-2005  . knee surgery, arthroscopic      sept 2013 , left knee   . MYOMECTOMY     Multiple  . MYOMECTOMY  [id number]  . OOPHORECTOMY     LSO  . SALPINGECTOMY     Right  . TONSILLECTOMY  1965  . VAGINAL HYSTERECTOMY  1992   partial    Social History   Social History  . Marital status: Married    Spouse name: N/A  . Number of children: 3  . Years of education: 12   Occupational History  .  [employer]   Social History Main Topics  . Smoking status: Former Smoker    Quit date: 11/25/1995  . Smokeless tobacco: Never Used  . Alcohol use No  . Drug use: No  . Sexual activity: No   Other Topics Concern  . Not on file   Social History Narrative   Right handed.  Caffeine 2 cups day.  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  Married, 2 bio kids, 1 step    Family History  Problem Relation Age of Onset  . Breast cancer Mother     Age 11  . Heart disease Brother   . Coronary artery disease Brother     stent placement  . Hypertension Brother   . Crohn's disease Brother     ROS: right hip pain but no fevers or chills, productive cough, hemoptysis, dysphasia, odynophagia, melena, hematochezia, dysuria, hematuria, rash, seizure activity, orthopnea, PND, pedal edema, claudication. Remaining systems are negative.  Physical Exam: Well-developed obese in no acute distress.  Skin is warm and dry.  HEENT is normal.  Neck is supple.  Chest is clear to auscultation with normal expansion.  Cardiovascular exam is regular rate and rhythm.  Abdominal exam nontender or distended. No masses palpated. Extremities show no edema. neuro grossly intact  ECG-Sinus rhythm at a rate of 53. First-degree AV block; personally reviewed  A/P  1 Atrial  fibrillation-patient had one documented episode of atrial fibrillation in 2010 with no recurrences today. We will continue beta blocker. Patient had seen Dr. Lovena Le at the time of her previous episode and he recommended aspirin. CHADSvasc 3. We have discussed options previously and she would like to avoid anticoagulation other than aspirin. She understands the higher risk of CVA.  2 hypertension-blood pressure controlled. Continue present medications. Potassium and renal function monitored by primary care.   3 hyperlipidemia-continue statin. Lipids and liver monitored by primary care.  4 dizziness-patient has had several dizzy episodes. Etiology unclear. Not clearly orthostatic in description. Her heart rate is 50. If she has more frequent episodes in the future we will consider reducing the dose for a monitor.   Kirk Ruths, MD

## 2017-01-28 ENCOUNTER — Ambulatory Visit (INDEPENDENT_AMBULATORY_CARE_PROVIDER_SITE_OTHER): Payer: 59 | Admitting: Cardiology

## 2017-01-28 ENCOUNTER — Encounter: Payer: Self-pay | Admitting: Cardiology

## 2017-01-28 VITALS — BP 134/86 | HR 53 | Ht 64.0 in | Wt 263.2 lb

## 2017-01-28 DIAGNOSIS — I4891 Unspecified atrial fibrillation: Secondary | ICD-10-CM

## 2017-01-28 DIAGNOSIS — E7800 Pure hypercholesterolemia, unspecified: Secondary | ICD-10-CM

## 2017-01-28 DIAGNOSIS — I10 Essential (primary) hypertension: Secondary | ICD-10-CM | POA: Diagnosis not present

## 2017-01-28 NOTE — Patient Instructions (Signed)
Your physician wants you to follow-up in: ONE YEAR WITH DR CRENSHAW You will receive a reminder letter in the mail two months in advance. If you don't receive a letter, please call our office to schedule the follow-up appointment.   If you need a refill on your cardiac medications before your next appointment, please call your pharmacy.  

## 2017-01-30 DIAGNOSIS — Z1231 Encounter for screening mammogram for malignant neoplasm of breast: Secondary | ICD-10-CM | POA: Diagnosis not present

## 2017-01-30 DIAGNOSIS — Z01419 Encounter for gynecological examination (general) (routine) without abnormal findings: Secondary | ICD-10-CM | POA: Diagnosis not present

## 2017-02-26 DIAGNOSIS — G4733 Obstructive sleep apnea (adult) (pediatric): Secondary | ICD-10-CM | POA: Diagnosis not present

## 2017-07-08 ENCOUNTER — Other Ambulatory Visit: Payer: Self-pay | Admitting: Gastroenterology

## 2017-07-08 DIAGNOSIS — R112 Nausea with vomiting, unspecified: Secondary | ICD-10-CM | POA: Diagnosis not present

## 2017-07-08 DIAGNOSIS — R1013 Epigastric pain: Secondary | ICD-10-CM

## 2017-07-09 ENCOUNTER — Ambulatory Visit
Admission: RE | Admit: 2017-07-09 | Discharge: 2017-07-09 | Disposition: A | Discharge status: Home / Self Care | Payer: 59 | Source: Ambulatory Visit | Attending: Gastroenterology | Admitting: Gastroenterology

## 2017-07-09 DIAGNOSIS — R109 Unspecified abdominal pain: Secondary | ICD-10-CM | POA: Diagnosis not present

## 2017-07-09 DIAGNOSIS — R1013 Epigastric pain: Secondary | ICD-10-CM

## 2017-07-22 ENCOUNTER — Other Ambulatory Visit: Payer: Self-pay | Admitting: Gastroenterology

## 2017-07-22 DIAGNOSIS — K760 Fatty (change of) liver, not elsewhere classified: Secondary | ICD-10-CM | POA: Diagnosis not present

## 2017-07-22 DIAGNOSIS — R112 Nausea with vomiting, unspecified: Secondary | ICD-10-CM | POA: Diagnosis not present

## 2017-07-22 DIAGNOSIS — K219 Gastro-esophageal reflux disease without esophagitis: Secondary | ICD-10-CM | POA: Diagnosis not present

## 2017-07-22 DIAGNOSIS — R935 Abnormal findings on diagnostic imaging of other abdominal regions, including retroperitoneum: Secondary | ICD-10-CM

## 2017-08-09 ENCOUNTER — Other Ambulatory Visit: Payer: 59

## 2017-08-12 DIAGNOSIS — G4733 Obstructive sleep apnea (adult) (pediatric): Secondary | ICD-10-CM | POA: Diagnosis not present

## 2017-08-16 ENCOUNTER — Inpatient Hospital Stay
Admission: RE | Admit: 2017-08-16 | Discharge: 2017-08-16 | Disposition: A | Discharge status: Home / Self Care | Payer: 59 | Source: Ambulatory Visit | Attending: Gastroenterology | Admitting: Gastroenterology

## 2017-08-16 ENCOUNTER — Other Ambulatory Visit: Payer: 59

## 2017-11-10 DIAGNOSIS — Z0000 Encounter for general adult medical examination without abnormal findings: Secondary | ICD-10-CM | POA: Diagnosis not present

## 2017-11-10 DIAGNOSIS — R82998 Other abnormal findings in urine: Secondary | ICD-10-CM | POA: Diagnosis not present

## 2017-11-10 DIAGNOSIS — E7849 Other hyperlipidemia: Secondary | ICD-10-CM | POA: Diagnosis not present

## 2017-11-12 DIAGNOSIS — Z23 Encounter for immunization: Secondary | ICD-10-CM | POA: Diagnosis not present

## 2017-11-12 DIAGNOSIS — K219 Gastro-esophageal reflux disease without esophagitis: Secondary | ICD-10-CM | POA: Diagnosis not present

## 2017-11-12 DIAGNOSIS — Z0000 Encounter for general adult medical examination without abnormal findings: Secondary | ICD-10-CM | POA: Diagnosis not present

## 2017-11-12 DIAGNOSIS — M859 Disorder of bone density and structure, unspecified: Secondary | ICD-10-CM | POA: Diagnosis not present

## 2017-11-12 DIAGNOSIS — E1165 Type 2 diabetes mellitus with hyperglycemia: Secondary | ICD-10-CM | POA: Diagnosis not present

## 2017-11-20 DIAGNOSIS — G4733 Obstructive sleep apnea (adult) (pediatric): Secondary | ICD-10-CM | POA: Diagnosis not present

## 2017-12-25 DIAGNOSIS — I10 Essential (primary) hypertension: Secondary | ICD-10-CM | POA: Diagnosis not present

## 2017-12-25 DIAGNOSIS — E1165 Type 2 diabetes mellitus with hyperglycemia: Secondary | ICD-10-CM | POA: Diagnosis not present

## 2018-01-28 DIAGNOSIS — E1165 Type 2 diabetes mellitus with hyperglycemia: Secondary | ICD-10-CM | POA: Diagnosis not present

## 2018-01-28 DIAGNOSIS — I10 Essential (primary) hypertension: Secondary | ICD-10-CM | POA: Diagnosis not present

## 2018-01-28 DIAGNOSIS — Z794 Long term (current) use of insulin: Secondary | ICD-10-CM | POA: Diagnosis not present

## 2018-01-29 DIAGNOSIS — M545 Low back pain: Secondary | ICD-10-CM | POA: Diagnosis not present

## 2018-01-29 DIAGNOSIS — M5136 Other intervertebral disc degeneration, lumbar region: Secondary | ICD-10-CM | POA: Diagnosis not present

## 2018-01-29 DIAGNOSIS — M9903 Segmental and somatic dysfunction of lumbar region: Secondary | ICD-10-CM | POA: Diagnosis not present

## 2018-02-02 DIAGNOSIS — M9903 Segmental and somatic dysfunction of lumbar region: Secondary | ICD-10-CM | POA: Diagnosis not present

## 2018-02-02 DIAGNOSIS — M545 Low back pain: Secondary | ICD-10-CM | POA: Diagnosis not present

## 2018-02-02 DIAGNOSIS — M5136 Other intervertebral disc degeneration, lumbar region: Secondary | ICD-10-CM | POA: Diagnosis not present

## 2018-02-04 DIAGNOSIS — M545 Low back pain: Secondary | ICD-10-CM | POA: Diagnosis not present

## 2018-02-04 DIAGNOSIS — M5136 Other intervertebral disc degeneration, lumbar region: Secondary | ICD-10-CM | POA: Diagnosis not present

## 2018-02-04 DIAGNOSIS — M9903 Segmental and somatic dysfunction of lumbar region: Secondary | ICD-10-CM | POA: Diagnosis not present

## 2018-02-05 DIAGNOSIS — M545 Low back pain: Secondary | ICD-10-CM | POA: Diagnosis not present

## 2018-02-05 DIAGNOSIS — M5136 Other intervertebral disc degeneration, lumbar region: Secondary | ICD-10-CM | POA: Diagnosis not present

## 2018-02-05 DIAGNOSIS — M9903 Segmental and somatic dysfunction of lumbar region: Secondary | ICD-10-CM | POA: Diagnosis not present

## 2018-04-01 DIAGNOSIS — I10 Essential (primary) hypertension: Secondary | ICD-10-CM | POA: Diagnosis not present

## 2018-04-01 DIAGNOSIS — Z1389 Encounter for screening for other disorder: Secondary | ICD-10-CM | POA: Diagnosis not present

## 2018-04-01 DIAGNOSIS — E119 Type 2 diabetes mellitus without complications: Secondary | ICD-10-CM | POA: Diagnosis not present

## 2018-04-01 DIAGNOSIS — E1165 Type 2 diabetes mellitus with hyperglycemia: Secondary | ICD-10-CM | POA: Diagnosis not present

## 2018-06-02 DIAGNOSIS — M5136 Other intervertebral disc degeneration, lumbar region: Secondary | ICD-10-CM | POA: Diagnosis not present

## 2018-06-02 DIAGNOSIS — M9903 Segmental and somatic dysfunction of lumbar region: Secondary | ICD-10-CM | POA: Diagnosis not present

## 2018-06-02 DIAGNOSIS — M545 Low back pain: Secondary | ICD-10-CM | POA: Diagnosis not present

## 2018-06-03 DIAGNOSIS — M9903 Segmental and somatic dysfunction of lumbar region: Secondary | ICD-10-CM | POA: Diagnosis not present

## 2018-06-03 DIAGNOSIS — M545 Low back pain: Secondary | ICD-10-CM | POA: Diagnosis not present

## 2018-06-03 DIAGNOSIS — M5136 Other intervertebral disc degeneration, lumbar region: Secondary | ICD-10-CM | POA: Diagnosis not present

## 2018-06-04 DIAGNOSIS — M9903 Segmental and somatic dysfunction of lumbar region: Secondary | ICD-10-CM | POA: Diagnosis not present

## 2018-06-04 DIAGNOSIS — M5136 Other intervertebral disc degeneration, lumbar region: Secondary | ICD-10-CM | POA: Diagnosis not present

## 2018-06-04 DIAGNOSIS — M545 Low back pain: Secondary | ICD-10-CM | POA: Diagnosis not present

## 2018-06-08 DIAGNOSIS — M545 Low back pain: Secondary | ICD-10-CM | POA: Diagnosis not present

## 2018-06-08 DIAGNOSIS — M5136 Other intervertebral disc degeneration, lumbar region: Secondary | ICD-10-CM | POA: Diagnosis not present

## 2018-06-08 DIAGNOSIS — M9903 Segmental and somatic dysfunction of lumbar region: Secondary | ICD-10-CM | POA: Diagnosis not present

## 2018-06-10 DIAGNOSIS — M5136 Other intervertebral disc degeneration, lumbar region: Secondary | ICD-10-CM | POA: Diagnosis not present

## 2018-06-10 DIAGNOSIS — M545 Low back pain: Secondary | ICD-10-CM | POA: Diagnosis not present

## 2018-06-10 DIAGNOSIS — M9903 Segmental and somatic dysfunction of lumbar region: Secondary | ICD-10-CM | POA: Diagnosis not present

## 2018-06-15 DIAGNOSIS — M5136 Other intervertebral disc degeneration, lumbar region: Secondary | ICD-10-CM | POA: Diagnosis not present

## 2018-06-15 DIAGNOSIS — M545 Low back pain: Secondary | ICD-10-CM | POA: Diagnosis not present

## 2018-06-15 DIAGNOSIS — M9903 Segmental and somatic dysfunction of lumbar region: Secondary | ICD-10-CM | POA: Diagnosis not present

## 2018-06-17 DIAGNOSIS — M9903 Segmental and somatic dysfunction of lumbar region: Secondary | ICD-10-CM | POA: Diagnosis not present

## 2018-06-17 DIAGNOSIS — M5136 Other intervertebral disc degeneration, lumbar region: Secondary | ICD-10-CM | POA: Diagnosis not present

## 2018-06-17 DIAGNOSIS — M545 Low back pain: Secondary | ICD-10-CM | POA: Diagnosis not present

## 2018-06-18 DIAGNOSIS — M545 Low back pain: Secondary | ICD-10-CM | POA: Diagnosis not present

## 2018-06-18 DIAGNOSIS — M9903 Segmental and somatic dysfunction of lumbar region: Secondary | ICD-10-CM | POA: Diagnosis not present

## 2018-06-18 DIAGNOSIS — M5136 Other intervertebral disc degeneration, lumbar region: Secondary | ICD-10-CM | POA: Diagnosis not present

## 2018-06-24 DIAGNOSIS — M5136 Other intervertebral disc degeneration, lumbar region: Secondary | ICD-10-CM | POA: Diagnosis not present

## 2018-06-24 DIAGNOSIS — M9903 Segmental and somatic dysfunction of lumbar region: Secondary | ICD-10-CM | POA: Diagnosis not present

## 2018-06-24 DIAGNOSIS — M545 Low back pain: Secondary | ICD-10-CM | POA: Diagnosis not present

## 2018-06-25 DIAGNOSIS — M9903 Segmental and somatic dysfunction of lumbar region: Secondary | ICD-10-CM | POA: Diagnosis not present

## 2018-06-25 DIAGNOSIS — M5136 Other intervertebral disc degeneration, lumbar region: Secondary | ICD-10-CM | POA: Diagnosis not present

## 2018-06-25 DIAGNOSIS — M545 Low back pain: Secondary | ICD-10-CM | POA: Diagnosis not present

## 2018-06-29 DIAGNOSIS — M5136 Other intervertebral disc degeneration, lumbar region: Secondary | ICD-10-CM | POA: Diagnosis not present

## 2018-06-29 DIAGNOSIS — M9903 Segmental and somatic dysfunction of lumbar region: Secondary | ICD-10-CM | POA: Diagnosis not present

## 2018-06-29 DIAGNOSIS — M545 Low back pain: Secondary | ICD-10-CM | POA: Diagnosis not present

## 2018-07-01 DIAGNOSIS — M9903 Segmental and somatic dysfunction of lumbar region: Secondary | ICD-10-CM | POA: Diagnosis not present

## 2018-07-01 DIAGNOSIS — M545 Low back pain: Secondary | ICD-10-CM | POA: Diagnosis not present

## 2018-07-01 DIAGNOSIS — M5136 Other intervertebral disc degeneration, lumbar region: Secondary | ICD-10-CM | POA: Diagnosis not present

## 2018-07-02 DIAGNOSIS — M9903 Segmental and somatic dysfunction of lumbar region: Secondary | ICD-10-CM | POA: Diagnosis not present

## 2018-07-02 DIAGNOSIS — M5136 Other intervertebral disc degeneration, lumbar region: Secondary | ICD-10-CM | POA: Diagnosis not present

## 2018-07-02 DIAGNOSIS — M545 Low back pain: Secondary | ICD-10-CM | POA: Diagnosis not present

## 2018-07-06 DIAGNOSIS — M9903 Segmental and somatic dysfunction of lumbar region: Secondary | ICD-10-CM | POA: Diagnosis not present

## 2018-07-06 DIAGNOSIS — M545 Low back pain: Secondary | ICD-10-CM | POA: Diagnosis not present

## 2018-07-06 DIAGNOSIS — M5136 Other intervertebral disc degeneration, lumbar region: Secondary | ICD-10-CM | POA: Diagnosis not present

## 2018-07-09 DIAGNOSIS — M9903 Segmental and somatic dysfunction of lumbar region: Secondary | ICD-10-CM | POA: Diagnosis not present

## 2018-07-09 DIAGNOSIS — M5136 Other intervertebral disc degeneration, lumbar region: Secondary | ICD-10-CM | POA: Diagnosis not present

## 2018-07-09 DIAGNOSIS — M545 Low back pain: Secondary | ICD-10-CM | POA: Diagnosis not present

## 2018-07-13 DIAGNOSIS — M5136 Other intervertebral disc degeneration, lumbar region: Secondary | ICD-10-CM | POA: Diagnosis not present

## 2018-07-13 DIAGNOSIS — M9903 Segmental and somatic dysfunction of lumbar region: Secondary | ICD-10-CM | POA: Diagnosis not present

## 2018-07-13 DIAGNOSIS — M545 Low back pain: Secondary | ICD-10-CM | POA: Diagnosis not present

## 2018-07-16 DIAGNOSIS — M9903 Segmental and somatic dysfunction of lumbar region: Secondary | ICD-10-CM | POA: Diagnosis not present

## 2018-07-16 DIAGNOSIS — M545 Low back pain: Secondary | ICD-10-CM | POA: Diagnosis not present

## 2018-07-16 DIAGNOSIS — M5136 Other intervertebral disc degeneration, lumbar region: Secondary | ICD-10-CM | POA: Diagnosis not present

## 2018-07-20 DIAGNOSIS — M9903 Segmental and somatic dysfunction of lumbar region: Secondary | ICD-10-CM | POA: Diagnosis not present

## 2018-07-20 DIAGNOSIS — M545 Low back pain: Secondary | ICD-10-CM | POA: Diagnosis not present

## 2018-07-20 DIAGNOSIS — M5136 Other intervertebral disc degeneration, lumbar region: Secondary | ICD-10-CM | POA: Diagnosis not present

## 2018-07-23 DIAGNOSIS — M9903 Segmental and somatic dysfunction of lumbar region: Secondary | ICD-10-CM | POA: Diagnosis not present

## 2018-07-23 DIAGNOSIS — M545 Low back pain: Secondary | ICD-10-CM | POA: Diagnosis not present

## 2018-07-23 DIAGNOSIS — M5136 Other intervertebral disc degeneration, lumbar region: Secondary | ICD-10-CM | POA: Diagnosis not present

## 2018-07-28 DIAGNOSIS — M9903 Segmental and somatic dysfunction of lumbar region: Secondary | ICD-10-CM | POA: Diagnosis not present

## 2018-07-28 DIAGNOSIS — M545 Low back pain: Secondary | ICD-10-CM | POA: Diagnosis not present

## 2018-07-28 DIAGNOSIS — M5136 Other intervertebral disc degeneration, lumbar region: Secondary | ICD-10-CM | POA: Diagnosis not present

## 2018-08-03 DIAGNOSIS — M9903 Segmental and somatic dysfunction of lumbar region: Secondary | ICD-10-CM | POA: Diagnosis not present

## 2018-08-03 DIAGNOSIS — M5136 Other intervertebral disc degeneration, lumbar region: Secondary | ICD-10-CM | POA: Diagnosis not present

## 2018-08-03 DIAGNOSIS — M545 Low back pain: Secondary | ICD-10-CM | POA: Diagnosis not present

## 2018-08-11 DIAGNOSIS — I480 Paroxysmal atrial fibrillation: Secondary | ICD-10-CM | POA: Diagnosis not present

## 2018-08-11 DIAGNOSIS — E1165 Type 2 diabetes mellitus with hyperglycemia: Secondary | ICD-10-CM | POA: Diagnosis not present

## 2018-08-11 DIAGNOSIS — G4733 Obstructive sleep apnea (adult) (pediatric): Secondary | ICD-10-CM | POA: Diagnosis not present

## 2018-09-07 DIAGNOSIS — J069 Acute upper respiratory infection, unspecified: Secondary | ICD-10-CM | POA: Diagnosis not present

## 2018-09-21 DIAGNOSIS — M859 Disorder of bone density and structure, unspecified: Secondary | ICD-10-CM | POA: Diagnosis not present

## 2018-09-21 DIAGNOSIS — E1165 Type 2 diabetes mellitus with hyperglycemia: Secondary | ICD-10-CM | POA: Diagnosis not present

## 2018-09-21 DIAGNOSIS — R5383 Other fatigue: Secondary | ICD-10-CM | POA: Diagnosis not present

## 2018-10-09 DIAGNOSIS — M79642 Pain in left hand: Secondary | ICD-10-CM | POA: Diagnosis not present

## 2018-10-09 DIAGNOSIS — R29898 Other symptoms and signs involving the musculoskeletal system: Secondary | ICD-10-CM | POA: Diagnosis not present

## 2018-10-09 DIAGNOSIS — E1165 Type 2 diabetes mellitus with hyperglycemia: Secondary | ICD-10-CM | POA: Diagnosis not present

## 2018-11-26 DIAGNOSIS — M8580 Other specified disorders of bone density and structure, unspecified site: Secondary | ICD-10-CM | POA: Diagnosis not present

## 2018-11-26 DIAGNOSIS — Z0000 Encounter for general adult medical examination without abnormal findings: Secondary | ICD-10-CM | POA: Diagnosis not present

## 2018-11-26 DIAGNOSIS — E1129 Type 2 diabetes mellitus with other diabetic kidney complication: Secondary | ICD-10-CM | POA: Diagnosis not present

## 2018-11-26 DIAGNOSIS — R82998 Other abnormal findings in urine: Secondary | ICD-10-CM | POA: Diagnosis not present

## 2018-11-26 DIAGNOSIS — E119 Type 2 diabetes mellitus without complications: Secondary | ICD-10-CM | POA: Diagnosis not present

## 2018-12-03 DIAGNOSIS — Z794 Long term (current) use of insulin: Secondary | ICD-10-CM | POA: Diagnosis not present

## 2018-12-03 DIAGNOSIS — I10 Essential (primary) hypertension: Secondary | ICD-10-CM | POA: Diagnosis not present

## 2018-12-03 DIAGNOSIS — E7849 Other hyperlipidemia: Secondary | ICD-10-CM | POA: Diagnosis not present

## 2018-12-03 DIAGNOSIS — Z1389 Encounter for screening for other disorder: Secondary | ICD-10-CM | POA: Diagnosis not present

## 2018-12-03 DIAGNOSIS — E1165 Type 2 diabetes mellitus with hyperglycemia: Secondary | ICD-10-CM | POA: Diagnosis not present

## 2018-12-03 DIAGNOSIS — Z0000 Encounter for general adult medical examination without abnormal findings: Secondary | ICD-10-CM | POA: Diagnosis not present

## 2018-12-16 DIAGNOSIS — M79642 Pain in left hand: Secondary | ICD-10-CM | POA: Diagnosis not present

## 2018-12-16 DIAGNOSIS — G5602 Carpal tunnel syndrome, left upper limb: Secondary | ICD-10-CM | POA: Diagnosis not present

## 2018-12-17 DIAGNOSIS — J101 Influenza due to other identified influenza virus with other respiratory manifestations: Secondary | ICD-10-CM | POA: Diagnosis not present

## 2018-12-17 DIAGNOSIS — J019 Acute sinusitis, unspecified: Secondary | ICD-10-CM | POA: Diagnosis not present

## 2018-12-17 DIAGNOSIS — J209 Acute bronchitis, unspecified: Secondary | ICD-10-CM | POA: Diagnosis not present

## 2018-12-29 DIAGNOSIS — J069 Acute upper respiratory infection, unspecified: Secondary | ICD-10-CM | POA: Diagnosis not present

## 2018-12-29 DIAGNOSIS — R05 Cough: Secondary | ICD-10-CM | POA: Diagnosis not present

## 2019-01-20 DIAGNOSIS — M859 Disorder of bone density and structure, unspecified: Secondary | ICD-10-CM | POA: Diagnosis not present

## 2019-01-20 DIAGNOSIS — E1165 Type 2 diabetes mellitus with hyperglycemia: Secondary | ICD-10-CM | POA: Diagnosis not present

## 2019-01-20 DIAGNOSIS — E119 Type 2 diabetes mellitus without complications: Secondary | ICD-10-CM | POA: Diagnosis not present

## 2019-01-20 DIAGNOSIS — I10 Essential (primary) hypertension: Secondary | ICD-10-CM | POA: Diagnosis not present

## 2019-01-22 DIAGNOSIS — R509 Fever, unspecified: Secondary | ICD-10-CM | POA: Diagnosis not present

## 2019-01-22 DIAGNOSIS — J020 Streptococcal pharyngitis: Secondary | ICD-10-CM | POA: Diagnosis not present

## 2019-04-06 DIAGNOSIS — E119 Type 2 diabetes mellitus without complications: Secondary | ICD-10-CM | POA: Diagnosis not present

## 2019-04-07 DIAGNOSIS — E1165 Type 2 diabetes mellitus with hyperglycemia: Secondary | ICD-10-CM | POA: Diagnosis not present

## 2019-04-07 DIAGNOSIS — J020 Streptococcal pharyngitis: Secondary | ICD-10-CM | POA: Diagnosis not present

## 2019-04-07 DIAGNOSIS — G4733 Obstructive sleep apnea (adult) (pediatric): Secondary | ICD-10-CM | POA: Diagnosis not present

## 2019-08-24 DIAGNOSIS — H6691 Otitis media, unspecified, right ear: Secondary | ICD-10-CM | POA: Diagnosis not present

## 2019-09-14 DIAGNOSIS — H6990 Unspecified Eustachian tube disorder, unspecified ear: Secondary | ICD-10-CM | POA: Diagnosis not present

## 2019-09-14 DIAGNOSIS — E119 Type 2 diabetes mellitus without complications: Secondary | ICD-10-CM | POA: Diagnosis not present

## 2019-09-14 DIAGNOSIS — H9209 Otalgia, unspecified ear: Secondary | ICD-10-CM | POA: Diagnosis not present

## 2019-11-12 DIAGNOSIS — J069 Acute upper respiratory infection, unspecified: Secondary | ICD-10-CM | POA: Diagnosis not present

## 2019-11-12 DIAGNOSIS — R05 Cough: Secondary | ICD-10-CM | POA: Diagnosis not present

## 2019-12-03 DIAGNOSIS — G4733 Obstructive sleep apnea (adult) (pediatric): Secondary | ICD-10-CM | POA: Diagnosis not present

## 2019-12-13 DIAGNOSIS — E119 Type 2 diabetes mellitus without complications: Secondary | ICD-10-CM | POA: Diagnosis not present

## 2019-12-13 DIAGNOSIS — E7849 Other hyperlipidemia: Secondary | ICD-10-CM | POA: Diagnosis not present

## 2019-12-13 DIAGNOSIS — M859 Disorder of bone density and structure, unspecified: Secondary | ICD-10-CM | POA: Diagnosis not present

## 2019-12-20 DIAGNOSIS — G4733 Obstructive sleep apnea (adult) (pediatric): Secondary | ICD-10-CM | POA: Diagnosis not present

## 2019-12-20 DIAGNOSIS — Z0000 Encounter for general adult medical examination without abnormal findings: Secondary | ICD-10-CM | POA: Diagnosis not present

## 2019-12-20 DIAGNOSIS — E1165 Type 2 diabetes mellitus with hyperglycemia: Secondary | ICD-10-CM | POA: Diagnosis not present

## 2019-12-20 DIAGNOSIS — Z1331 Encounter for screening for depression: Secondary | ICD-10-CM | POA: Diagnosis not present

## 2019-12-20 DIAGNOSIS — I10 Essential (primary) hypertension: Secondary | ICD-10-CM | POA: Diagnosis not present

## 2019-12-27 ENCOUNTER — Inpatient Hospital Stay (HOSPITAL_COMMUNITY)
Admission: EM | Admit: 2019-12-27 | Discharge: 2019-12-29 | DRG: 175 | Disposition: A | Discharge status: Home / Self Care | Payer: BC Managed Care – PPO | Attending: Family Medicine | Admitting: Family Medicine

## 2019-12-27 ENCOUNTER — Emergency Department (HOSPITAL_COMMUNITY): Payer: BC Managed Care – PPO

## 2019-12-27 ENCOUNTER — Telehealth: Payer: Self-pay | Admitting: Cardiology

## 2019-12-27 DIAGNOSIS — Z7982 Long term (current) use of aspirin: Secondary | ICD-10-CM | POA: Diagnosis not present

## 2019-12-27 DIAGNOSIS — Z87891 Personal history of nicotine dependence: Secondary | ICD-10-CM | POA: Diagnosis not present

## 2019-12-27 DIAGNOSIS — Z8379 Family history of other diseases of the digestive system: Secondary | ICD-10-CM

## 2019-12-27 DIAGNOSIS — I10 Essential (primary) hypertension: Secondary | ICD-10-CM | POA: Diagnosis not present

## 2019-12-27 DIAGNOSIS — Z8249 Family history of ischemic heart disease and other diseases of the circulatory system: Secondary | ICD-10-CM

## 2019-12-27 DIAGNOSIS — E1140 Type 2 diabetes mellitus with diabetic neuropathy, unspecified: Secondary | ICD-10-CM | POA: Diagnosis present

## 2019-12-27 DIAGNOSIS — R0602 Shortness of breath: Secondary | ICD-10-CM | POA: Diagnosis not present

## 2019-12-27 DIAGNOSIS — G4730 Sleep apnea, unspecified: Secondary | ICD-10-CM | POA: Diagnosis not present

## 2019-12-27 DIAGNOSIS — I82461 Acute embolism and thrombosis of right calf muscular vein: Secondary | ICD-10-CM

## 2019-12-27 DIAGNOSIS — Z6841 Body Mass Index (BMI) 40.0 and over, adult: Secondary | ICD-10-CM | POA: Diagnosis not present

## 2019-12-27 DIAGNOSIS — Z832 Family history of diseases of the blood and blood-forming organs and certain disorders involving the immune mechanism: Secondary | ICD-10-CM

## 2019-12-27 DIAGNOSIS — Z90711 Acquired absence of uterus with remaining cervical stump: Secondary | ICD-10-CM

## 2019-12-27 DIAGNOSIS — Z79899 Other long term (current) drug therapy: Secondary | ICD-10-CM | POA: Diagnosis not present

## 2019-12-27 DIAGNOSIS — Z803 Family history of malignant neoplasm of breast: Secondary | ICD-10-CM | POA: Diagnosis not present

## 2019-12-27 DIAGNOSIS — Z9089 Acquired absence of other organs: Secondary | ICD-10-CM | POA: Diagnosis not present

## 2019-12-27 DIAGNOSIS — I2609 Other pulmonary embolism with acute cor pulmonale: Secondary | ICD-10-CM | POA: Diagnosis not present

## 2019-12-27 DIAGNOSIS — Z9079 Acquired absence of other genital organ(s): Secondary | ICD-10-CM

## 2019-12-27 DIAGNOSIS — E119 Type 2 diabetes mellitus without complications: Secondary | ICD-10-CM | POA: Diagnosis not present

## 2019-12-27 DIAGNOSIS — M7989 Other specified soft tissue disorders: Secondary | ICD-10-CM | POA: Diagnosis not present

## 2019-12-27 DIAGNOSIS — I110 Hypertensive heart disease with heart failure: Secondary | ICD-10-CM | POA: Diagnosis present

## 2019-12-27 DIAGNOSIS — Z86711 Personal history of pulmonary embolism: Secondary | ICD-10-CM | POA: Diagnosis present

## 2019-12-27 DIAGNOSIS — E662 Morbid (severe) obesity with alveolar hypoventilation: Secondary | ICD-10-CM | POA: Diagnosis not present

## 2019-12-27 DIAGNOSIS — I480 Paroxysmal atrial fibrillation: Secondary | ICD-10-CM

## 2019-12-27 DIAGNOSIS — Z20822 Contact with and (suspected) exposure to covid-19: Secondary | ICD-10-CM | POA: Diagnosis not present

## 2019-12-27 DIAGNOSIS — Z885 Allergy status to narcotic agent status: Secondary | ICD-10-CM

## 2019-12-27 DIAGNOSIS — I2699 Other pulmonary embolism without acute cor pulmonale: Secondary | ICD-10-CM | POA: Diagnosis not present

## 2019-12-27 DIAGNOSIS — Z7984 Long term (current) use of oral hypoglycemic drugs: Secondary | ICD-10-CM

## 2019-12-27 DIAGNOSIS — K219 Gastro-esophageal reflux disease without esophagitis: Secondary | ICD-10-CM | POA: Diagnosis not present

## 2019-12-27 DIAGNOSIS — I5032 Chronic diastolic (congestive) heart failure: Secondary | ICD-10-CM | POA: Diagnosis not present

## 2019-12-27 DIAGNOSIS — J9601 Acute respiratory failure with hypoxia: Secondary | ICD-10-CM | POA: Diagnosis present

## 2019-12-27 DIAGNOSIS — Z90721 Acquired absence of ovaries, unilateral: Secondary | ICD-10-CM | POA: Diagnosis not present

## 2019-12-27 DIAGNOSIS — E785 Hyperlipidemia, unspecified: Secondary | ICD-10-CM | POA: Diagnosis not present

## 2019-12-27 LAB — BASIC METABOLIC PANEL
Anion gap: 14 (ref 5–15)
BUN: 16 mg/dL (ref 6–20)
CO2: 25 mmol/L (ref 22–32)
Calcium: 9.1 mg/dL (ref 8.9–10.3)
Chloride: 97 mmol/L — ABNORMAL LOW (ref 98–111)
Creatinine, Ser: 0.75 mg/dL (ref 0.44–1.00)
GFR calc Af Amer: >60 mL/min (ref >60)
GFR calc non Af Amer: >60 mL/min (ref >60)
Glucose, Bld: 333 mg/dL — ABNORMAL HIGH (ref 70–99)
Potassium: 3.7 mmol/L (ref 3.5–5.1)
Sodium: 136 mmol/L (ref 135–145)

## 2019-12-27 LAB — CBC
HCT: 40.6 % (ref 36.0–46.0)
Hemoglobin: 11.6 g/dL — ABNORMAL LOW (ref 12.0–15.0)
MCH: 21.9 pg — ABNORMAL LOW (ref 26.0–34.0)
MCHC: 28.6 g/dL — ABNORMAL LOW (ref 30.0–36.0)
MCV: 76.6 fL — ABNORMAL LOW (ref 80.0–100.0)
Platelets: 377 10*3/uL (ref 150–400)
RBC: 5.3 MIL/uL — ABNORMAL HIGH (ref 3.87–5.11)
RDW: 16.5 % — ABNORMAL HIGH (ref 11.5–15.5)
WBC: 11.2 10*3/uL — ABNORMAL HIGH (ref 4.0–10.5)
nRBC: 0 % (ref 0.0–0.2)

## 2019-12-27 LAB — TROPONIN I (HIGH SENSITIVITY)
Troponin I (High Sensitivity): 4 ng/L (ref <18)
Troponin I (High Sensitivity): 4 ng/L (ref <18)

## 2019-12-27 LAB — D-DIMER, QUANTITATIVE: D-Dimer, Quant: 2.79 ug/mL-FEU — ABNORMAL HIGH (ref 0.00–0.50)

## 2019-12-27 LAB — POC SARS CORONAVIRUS 2 AG -  ED: SARS Coronavirus 2 Ag: NEGATIVE

## 2019-12-27 MED ORDER — SODIUM CHLORIDE 0.9% FLUSH: 3.0000 mL | Freq: Once | INTRAVENOUS | Status: DC

## 2019-12-27 NOTE — ED Notes (Signed)
Pt came to the ED per triage complaint. Pt conscious, breathing, and A&Ox4. Pt brought back to [address] via wheelchair. Pt endorses "I started to have chest pressure in the center of my chest that started Saturday morning with activity but went away with rest". Chest rise and fall equally with non-labored breathing. Lungs clear apex to base. Abd soft and non-tender. Pt denies chest pain, n/v/d, shortness of breath, and f/c. PIVC placed on the RAC with a 20G which had positive blood return and flushed without pain or infiltration. Blood collected, labeled, and sent to lab. Bed in lowest position with call light within reach. Pt on continuous blood pressure, pulse ox, and cardiac monitor. Will continue to monitor. Awaiting MD eval. No distress noted.

## 2019-12-27 NOTE — ED Notes (Signed)
Browning PA at bedside.

## 2019-12-27 NOTE — ED Notes (Addendum)
Pt ambulated to and from bathroom with a smooth and steady gait. No distress noted. Pt spo2 dropped to 80% RA. Pt placed on 3lpm [place]. Browning PA made aware.

## 2019-12-27 NOTE — ED Triage Notes (Signed)
Pt states beginning Saturday she has been sob with any exertion and also feels a pressure in the center of her chest. Pt currently has no pain but did get sob from walking into triage.

## 2019-12-27 NOTE — Telephone Encounter (Signed)
Spoke with pt, she reports a couple weeks now she has noticed her heart racing off and on when getting up moving around. She reports this weekend it occurred more frequently. She also reports a heaviness in her chest when getting up and walking around at work or at home. It was occurring off and on but today it has happened every time she has exerted herself. It feels heavy in her chest as if an elephant was sitting on her chest and it takes her breath away. She will stop what she is doing and the heaviness will go away. The heaviness is not related to any racing heart or movement. Advised the patient to go to the ER for evaluation or call 911. Appointment for tomorrow canceled. Patient voiced understanding of recommendations and will go to [facility].

## 2019-12-27 NOTE — ED Provider Notes (Signed)
[facility] Provider Note   CSN: [id number] Arrival date & time: 12/27/19  1605     History Chief Complaint  Patient presents with  . Shortness of Breath    Mary Sharp is a 59 y.o. female.  Patient with PMH of DM, HTN, afib, on aspirin and metoprolol, but not otherwise anticoagulated, presents to the ED with a chief complaint of SOB and palpitations.  She states that she has been having worsening heart palpitations with chest pressure and exertional dyspnea for the past several days.  She states that she has some cough, but no fever.  She was tested for COVID back in December and was negative.  She states that she has had increased swelling on her legs.  Denies any hx of CHF.  She states that her brother has a clotting disorder, but she is uncertain what kind.  The history is provided by the patient. No language interpreter was used.       Past Medical History:  Diagnosis Date  . Acid reflux   . Atrial fibrillation ([place])   . Chest pain   . Diabetes mellitus   . Elevated cholesterol   . Fibroid   . Hydrosalpinx    Right  . Hypertension   . Migraine   . Ovarian cyst    Dermoid-Right   Serous cystadenoma-Left  . Sleep apnea with use of continuous positive airway pressure (CPAP)     02-19-13 , AHI 59.8 , titration to 10 cm water.     Patient Active Problem List   Diagnosis Date Noted  . Uncontrolled type 2 diabetes mellitus with ketoacidosis without coma, without long-term current use of insulin ([place]) 11/06/2015  . Obesity hypoventilation syndrome ([place]) 11/03/2014  . Neuropathy due to type 2 diabetes mellitus ([place]) 11/03/2014  . Chest pain 12/24/2013  . Hyperlipidemia 12/24/2013  . OSA on CPAP 11/03/2013  . Obesity, morbid ([place]) 11/03/2013  . Morbid obesity with BMI of 45.0-49.9, adult ([place]) 05/05/2013  . Sleep apnea with use of continuous positive airway pressure (CPAP)   . Fibroid   . Ovarian cyst   . Hydrosalpinx   . Acid  reflux   . Diabetes mellitus   . Hypertension   . MORBID OBESITY 01/22/2010  . ESSENTIAL HYPERTENSION, BENIGN 01/22/2010  . ATRIAL FIBRILLATION 01/22/2010  . GERD 01/22/2010    Past Surgical History:  Procedure Laterality Date  . ABDOMINAL SURGERY     Expl. Laparotomy  . GANGLION CYST EXCISION Right [id number]   wrist  . KENALOG INJECTION    . KNEE ARTHROSCOPY Left 08/20/12   torn meniscus  . KNEE SURGERY Right 2004-2005  . knee surgery, arthroscopic      sept 2013 , left knee   . MYOMECTOMY     Multiple  . MYOMECTOMY  [id number]  . OOPHORECTOMY     LSO  . SALPINGECTOMY     Right  . TONSILLECTOMY  1965  . VAGINAL HYSTERECTOMY  1992   partial     OB History    Gravida  3   Para  2   Term  2   Preterm      AB  1   Living  2     SAB      TAB      Ectopic      Multiple      Live Births              Family History  Problem  Relation Age of Onset  . Breast cancer Mother        Age 21  . Heart disease Brother   . Coronary artery disease Brother        stent placement  . Hypertension Brother   . Crohn's disease Brother     Social History   Tobacco Use  . Smoking status: Former Smoker    Quit date: 11/25/1995    Years since quitting: 24.1  . Smokeless tobacco: Never Used  Substance Use Topics  . Alcohol use: No  . Drug use: No    Home Medications Prior to Admission medications   Medication Sig Start Date End Date Taking? Authorizing Provider  amLODipine (NORVASC) 10 MG tablet Take 10 mg by mouth every evening.     [provider]  aspirin 325 MG tablet Take 325 mg by mouth every morning.     [provider]  Empagliflozin-Metformin HCl (SYNJARDY) 12.03-999 MG TABS Take 1 tablet by mouth 2 (two) times daily.    [provider]  esomeprazole (NEXIUM) 40 MG capsule Take 40 mg by mouth every morning.     [provider]  fexofenadine (ALLEGRA) 180 MG tablet Take 180 mg by mouth every morning.    [provider]  glipiZIDE (GLUCOTROL) 10 MG tablet Take 10 mg by mouth 2 (two) times daily before a meal.    [provider]  ibuprofen (ADVIL,MOTRIN) 600 MG tablet Take 600 mg by mouth every 6 (six) hours as needed.    [provider]  losartan (COZAAR) 100 MG tablet Take 50 mg by mouth 2 (two) times daily.    [provider]  metoprolol (LOPRESSOR) 50 MG tablet Take 50 mg by mouth 2 (two) times daily.    [provider]  Multiple Vitamin (MULTIVITAMIN) tablet Take 1 tablet by mouth daily.    [provider]  pravastatin (PRAVACHOL) 40 MG tablet Take 40 mg by mouth every morning.     [provider]    Allergies    Codeine, Hydrocodone-acetaminophen, Propoxyphene n-acetaminophen, and Morphine  Review of Systems   Review of Systems  All other systems reviewed and are negative.   Physical Exam Updated Vital Signs BP 133/70 (BP Location: Right Wrist)   Pulse 70   Temp 98.6 F (37 C) (Oral)   Resp 17   Ht 5\' 4"  (1.626 m)   Wt 127 kg   SpO2 93%   BMI 48.06 kg/m   Physical Exam Vitals and nursing note reviewed.  Constitutional:      General: She is not in acute distress.    Appearance: She is well-developed.  HENT:     Head: Normocephalic and atraumatic.  Eyes:     Conjunctiva/sclera: Conjunctivae normal.  Cardiovascular:     Rate and Rhythm: Normal rate and regular rhythm.     Heart sounds: No murmur.  Pulmonary:     Effort: Pulmonary effort is normal. No respiratory distress.     Breath sounds: Normal breath sounds.  Abdominal:     Palpations: Abdomen is soft.     Tenderness: There is no abdominal tenderness.  Musculoskeletal:        General: Normal range of motion.     Cervical back: Neck supple.  Skin:    General: Skin is warm and dry.  Neurological:     Mental Status: She is alert and oriented to person, place, and time.  Psychiatric:        Mood  and Affect: Mood normal.        Behavior: Behavior normal.      ED Results / Procedures / Treatments   Labs (all labs ordered are listed, but only abnormal results are displayed) Labs Reviewed  BASIC METABOLIC PANEL - Abnormal; Notable for the following components:      Result Value   Chloride 97 (*)    Glucose, Bld 333 (*)    All other components within normal limits  CBC - Abnormal; Notable for the following components:   WBC 11.2 (*)    RBC 5.30 (*)    Hemoglobin 11.6 (*)    MCV 76.6 (*)    MCH 21.9 (*)    MCHC 28.6 (*)    RDW 16.5 (*)    All other components within normal limits  D-DIMER, QUANTITATIVE (NOT AT [facility]) - Abnormal; Notable for the following components:   D-Dimer, Quant 2.79 (*)    All other components within normal limits  RESPIRATORY PANEL BY RT PCR (FLU A&B, COVID)  BRAIN NATRIURETIC PEPTIDE  HEPARIN LEVEL (UNFRACTIONATED)  I-STAT BETA HCG BLOOD, ED ([facility], [facility], [facility] ONLY)  POC SARS CORONAVIRUS 2 AG -  ED  TROPONIN I (HIGH SENSITIVITY)  TROPONIN I (HIGH SENSITIVITY)    EKG None  Radiology DG Chest 2 View  Result Date: 12/27/2019 CLINICAL DATA:  59 year old female with shortness of breath. EXAM: CHEST - 2 VIEW COMPARISON:  Chest radiograph dated 12/17/2013. FINDINGS: The lungs are clear. There is no pleural effusion pneumothorax. Stable cardiac silhouette. No acute osseous pathology. Degenerative changes of the spine. IMPRESSION: No active cardiopulmonary disease. Electronically Signed   By: Anner Crete M.D.   On: 12/27/2019 16:54    Procedures .Critical Care Performed by: Montine Circle, PA-C Authorized by: Montine Circle, PA-C   Critical care provider statement:    Critical care time (minutes):  35   Critical care was necessary to treat or prevent imminent or life-threatening deterioration of the following conditions:  Respiratory failure   Critical care was time spent personally by me on the following activities:  Discussions with consultants, evaluation of patient's response to treatment, examination  of patient, ordering and performing treatments and interventions, ordering and review of laboratory studies, ordering and review of radiographic studies, pulse oximetry, re-evaluation of patient's condition, obtaining history from patient or surrogate and review of old charts   (including critical care time)  Medications Ordered in ED Medications  sodium chloride flush (NS) 0.9 % injection 3 mL (has no administration in time range)    ED Course  I have reviewed the triage vital signs and the nursing notes.  Pertinent labs & imaging results that were available during my care of the patient were reviewed by me and considered in my medical decision making (see chart for details).    MDM Rules/Calculators/A&P                      Patient here with SOB, chest pressure and palpitations.  Hx of afib, but only aspirin and metoprolol.  EKG shows sinus tachycardia.  O2 sat is 92-93% on RA at rest.  Her symptoms of exertional dyspnea with chest pressure concern me for both PE and CHF.  CXR shows no vascular congestion or evidence of CHF.  She does have fairly significant swelling on her lower extremities, but no pain.  She sits all day for work and her brother has a clotting disorder.  Will check d-dimer and BNP.  I anticipate  that the patient will need to be admitted at least for observation.  D-dimer is elevated, will proceed with PE study.  O2 dropped to 80% with ambulation on RA.  Remains 92-93% at rest on RA.  Will give some supplemental [place].  CT shows bilateral PEs, but no right heart strain.  Will start heparin.  Will admit to medicine.   Appreciate Dr. Alcario Drought for admitting the patient.  Final Clinical Impression(s) / ED Diagnoses Final diagnoses:  Other acute pulmonary embolism without acute cor pulmonale [facility])    Rx / DC Orders ED Discharge Orders    None       Montine Circle, PA-C 12/28/19 0147    Ward, Delice Bison, DO 12/28/19 [id number]

## 2019-12-27 NOTE — Telephone Encounter (Signed)
Patient c/o Palpitations:  High priority if patient c/o lightheadedness, shortness of breath, or chest pain  1) How long have you had palpitations/irregular HR/ Afib? Are you having the symptoms now? *yes- feel like in rhythm 2) , 3) Are you currently experiencing lightheadedness, SOB or a little short of breath 4)  CP?- chest feels heavy  5) Do you have a history of afib (atrial fibrillation) or irregular heart rhythm? yes  6) Have you checked your BP or HR? (document readings if available):    7) Are you experiencing any other symptoms? no

## 2019-12-28 ENCOUNTER — Ambulatory Visit: Payer: 59 | Admitting: Cardiology

## 2019-12-28 ENCOUNTER — Other Ambulatory Visit: Payer: Self-pay

## 2019-12-28 ENCOUNTER — Encounter (HOSPITAL_COMMUNITY): Payer: Self-pay | Admitting: Radiology

## 2019-12-28 ENCOUNTER — Inpatient Hospital Stay (HOSPITAL_COMMUNITY): Payer: BC Managed Care – PPO

## 2019-12-28 ENCOUNTER — Emergency Department (HOSPITAL_COMMUNITY): Payer: BC Managed Care – PPO

## 2019-12-28 DIAGNOSIS — K219 Gastro-esophageal reflux disease without esophagitis: Secondary | ICD-10-CM | POA: Diagnosis present

## 2019-12-28 DIAGNOSIS — Z90711 Acquired absence of uterus with remaining cervical stump: Secondary | ICD-10-CM | POA: Diagnosis not present

## 2019-12-28 DIAGNOSIS — I480 Paroxysmal atrial fibrillation: Secondary | ICD-10-CM

## 2019-12-28 DIAGNOSIS — Z9079 Acquired absence of other genital organ(s): Secondary | ICD-10-CM | POA: Diagnosis not present

## 2019-12-28 DIAGNOSIS — R0602 Shortness of breath: Secondary | ICD-10-CM

## 2019-12-28 DIAGNOSIS — Z8379 Family history of other diseases of the digestive system: Secondary | ICD-10-CM | POA: Diagnosis not present

## 2019-12-28 DIAGNOSIS — E119 Type 2 diabetes mellitus without complications: Secondary | ICD-10-CM

## 2019-12-28 DIAGNOSIS — E662 Morbid (severe) obesity with alveolar hypoventilation: Secondary | ICD-10-CM | POA: Diagnosis present

## 2019-12-28 DIAGNOSIS — I2699 Other pulmonary embolism without acute cor pulmonale: Secondary | ICD-10-CM

## 2019-12-28 DIAGNOSIS — I110 Hypertensive heart disease with heart failure: Secondary | ICD-10-CM | POA: Diagnosis present

## 2019-12-28 DIAGNOSIS — I82461 Acute embolism and thrombosis of right calf muscular vein: Secondary | ICD-10-CM | POA: Diagnosis present

## 2019-12-28 DIAGNOSIS — M7989 Other specified soft tissue disorders: Secondary | ICD-10-CM | POA: Diagnosis not present

## 2019-12-28 DIAGNOSIS — Z8249 Family history of ischemic heart disease and other diseases of the circulatory system: Secondary | ICD-10-CM | POA: Diagnosis not present

## 2019-12-28 DIAGNOSIS — I10 Essential (primary) hypertension: Secondary | ICD-10-CM

## 2019-12-28 DIAGNOSIS — Z90721 Acquired absence of ovaries, unilateral: Secondary | ICD-10-CM | POA: Diagnosis not present

## 2019-12-28 DIAGNOSIS — G4730 Sleep apnea, unspecified: Secondary | ICD-10-CM

## 2019-12-28 DIAGNOSIS — Z6841 Body Mass Index (BMI) 40.0 and over, adult: Secondary | ICD-10-CM | POA: Diagnosis not present

## 2019-12-28 DIAGNOSIS — Z86711 Personal history of pulmonary embolism: Secondary | ICD-10-CM | POA: Diagnosis present

## 2019-12-28 DIAGNOSIS — E785 Hyperlipidemia, unspecified: Secondary | ICD-10-CM | POA: Diagnosis present

## 2019-12-28 DIAGNOSIS — J9601 Acute respiratory failure with hypoxia: Secondary | ICD-10-CM | POA: Diagnosis present

## 2019-12-28 DIAGNOSIS — Z7984 Long term (current) use of oral hypoglycemic drugs: Secondary | ICD-10-CM | POA: Diagnosis not present

## 2019-12-28 DIAGNOSIS — Z79899 Other long term (current) drug therapy: Secondary | ICD-10-CM | POA: Diagnosis not present

## 2019-12-28 DIAGNOSIS — Z87891 Personal history of nicotine dependence: Secondary | ICD-10-CM | POA: Diagnosis not present

## 2019-12-28 DIAGNOSIS — E1140 Type 2 diabetes mellitus with diabetic neuropathy, unspecified: Secondary | ICD-10-CM | POA: Diagnosis present

## 2019-12-28 DIAGNOSIS — Z803 Family history of malignant neoplasm of breast: Secondary | ICD-10-CM | POA: Diagnosis not present

## 2019-12-28 DIAGNOSIS — Z832 Family history of diseases of the blood and blood-forming organs and certain disorders involving the immune mechanism: Secondary | ICD-10-CM | POA: Diagnosis not present

## 2019-12-28 DIAGNOSIS — Z9089 Acquired absence of other organs: Secondary | ICD-10-CM | POA: Diagnosis not present

## 2019-12-28 DIAGNOSIS — I5032 Chronic diastolic (congestive) heart failure: Secondary | ICD-10-CM | POA: Diagnosis present

## 2019-12-28 DIAGNOSIS — Z7982 Long term (current) use of aspirin: Secondary | ICD-10-CM | POA: Diagnosis not present

## 2019-12-28 DIAGNOSIS — Z20822 Contact with and (suspected) exposure to covid-19: Secondary | ICD-10-CM | POA: Diagnosis present

## 2019-12-28 LAB — GLUCOSE, CAPILLARY
Glucose-Capillary: 109 mg/dL — ABNORMAL HIGH (ref 70–99)
Glucose-Capillary: 149 mg/dL — ABNORMAL HIGH (ref 70–99)
Glucose-Capillary: 164 mg/dL — ABNORMAL HIGH (ref 70–99)
Glucose-Capillary: 182 mg/dL — ABNORMAL HIGH (ref 70–99)

## 2019-12-28 LAB — CBC
HCT: 36 % (ref 36.0–46.0)
Hemoglobin: 10.6 g/dL — ABNORMAL LOW (ref 12.0–15.0)
MCH: 22.1 pg — ABNORMAL LOW (ref 26.0–34.0)
MCHC: 29.4 g/dL — ABNORMAL LOW (ref 30.0–36.0)
MCV: 75.2 fL — ABNORMAL LOW (ref 80.0–100.0)
Platelets: 319 10*3/uL (ref 150–400)
RBC: 4.79 MIL/uL (ref 3.87–5.11)
RDW: 16.5 % — ABNORMAL HIGH (ref 11.5–15.5)
WBC: 9.2 10*3/uL (ref 4.0–10.5)
nRBC: 0 % (ref 0.0–0.2)

## 2019-12-28 LAB — HIV ANTIBODY (ROUTINE TESTING W REFLEX): HIV Screen 4th Generation wRfx: NONREACTIVE — AB

## 2019-12-28 LAB — BASIC METABOLIC PANEL
Anion gap: 10 (ref 5–15)
BUN: 11 mg/dL (ref 6–20)
CO2: 27 mmol/L (ref 22–32)
Calcium: 8.9 mg/dL (ref 8.9–10.3)
Chloride: 102 mmol/L (ref 98–111)
Creatinine, Ser: 0.62 mg/dL (ref 0.44–1.00)
GFR calc Af Amer: >60 mL/min (ref >60)
GFR calc non Af Amer: >60 mL/min (ref >60)
Glucose, Bld: 120 mg/dL — ABNORMAL HIGH (ref 70–99)
Potassium: 3.7 mmol/L (ref 3.5–5.1)
Sodium: 139 mmol/L (ref 135–145)

## 2019-12-28 LAB — HEPARIN LEVEL (UNFRACTIONATED): Heparin Unfractionated: 0.2 IU/mL — ABNORMAL LOW (ref 0.30–0.70)

## 2019-12-28 LAB — I-STAT BETA HCG BLOOD, ED (NOT ORDERABLE): I-stat hCG, quantitative: <5 m[IU]/mL (ref <5)

## 2019-12-28 LAB — ECHOCARDIOGRAM COMPLETE
Height: 64 in
Weight: 4539.71 oz

## 2019-12-28 LAB — BRAIN NATRIURETIC PEPTIDE: B Natriuretic Peptide: 43 pg/mL (ref 0.0–100.0)

## 2019-12-28 LAB — TSH: TSH: 0.361 u[IU]/mL (ref 0.350–4.500)

## 2019-12-28 LAB — RESPIRATORY PANEL BY RT PCR (FLU A&B, COVID)
Influenza A by PCR: NEGATIVE
Influenza B by PCR: NEGATIVE
SARS Coronavirus 2 by RT PCR: NEGATIVE

## 2019-12-28 LAB — HEMOGLOBIN A1C
Hgb A1c MFr Bld: 9.6 % — ABNORMAL HIGH (ref 4.8–5.6)
Mean Plasma Glucose: 228.82 mg/dL

## 2019-12-28 LAB — CBG MONITORING, ED: Glucose-Capillary: 126 mg/dL — ABNORMAL HIGH (ref 70–99)

## 2019-12-28 MED ORDER — IOHEXOL 350 MG/ML SOLN
75.0000 mL | Freq: Once | INTRAVENOUS | Status: AC | PRN
  Administered 2019-12-28: 75 mL via INTRAVENOUS

## 2019-12-28 MED ORDER — APIXABAN 5 MG PO TABS
10.0000 mg | ORAL_TABLET | Freq: Two times a day (BID) | ORAL | Status: DC
  Administered 2019-12-28 – 2019-12-29 (×2): 10 mg via ORAL
  Filled 2019-12-28 (×2): qty 2

## 2019-12-28 MED ORDER — ACETAMINOPHEN 325 MG PO TABS: 650.0000 mg | ORAL_TABLET | Freq: Four times a day (QID) | ORAL | Status: DC | PRN

## 2019-12-28 MED ORDER — HEPARIN BOLUS VIA INFUSION
5000.0000 [IU] | Freq: Once | INTRAVENOUS | Status: AC
  Administered 2019-12-28: 5000 [IU] via INTRAVENOUS
  Filled 2019-12-28: qty 5000

## 2019-12-28 MED ORDER — INSULIN ASPART 100 UNIT/ML ~~LOC~~ SOLN: 0.0000 [IU] | Freq: Every day | SUBCUTANEOUS | Status: DC

## 2019-12-28 MED ORDER — ACETAMINOPHEN 650 MG RE SUPP: 650.0000 mg | Freq: Four times a day (QID) | RECTAL | Status: DC | PRN

## 2019-12-28 MED ORDER — AMLODIPINE BESYLATE 10 MG PO TABS
10.0000 mg | ORAL_TABLET | Freq: Every evening | ORAL | Status: DC
  Administered 2019-12-28: 10 mg via ORAL
  Filled 2019-12-28: qty 1

## 2019-12-28 MED ORDER — COLESEVELAM HCL 625 MG PO TABS
625.0000 mg | ORAL_TABLET | Freq: Every day | ORAL | Status: DC
  Administered 2019-12-28 – 2019-12-29 (×2): 625 mg via ORAL
  Filled 2019-12-28 (×2): qty 1

## 2019-12-28 MED ORDER — INSULIN GLARGINE 100 UNIT/ML ~~LOC~~ SOLN
56.0000 [IU] | Freq: Every day | SUBCUTANEOUS | Status: DC
  Administered 2019-12-28 – 2019-12-29 (×2): 56 [IU] via SUBCUTANEOUS
  Filled 2019-12-28 (×5): qty 0.56

## 2019-12-28 MED ORDER — HEPARIN (PORCINE) 25000 UT/250ML-% IV SOLN
1450.0000 [IU]/h | INTRAVENOUS | Status: AC
  Administered 2019-12-28: 1200 [IU]/h via INTRAVENOUS
  Administered 2019-12-28: 1450 [IU]/h via INTRAVENOUS
  Filled 2019-12-28 (×2): qty 250

## 2019-12-28 MED ORDER — INSULIN ASPART 100 UNIT/ML ~~LOC~~ SOLN
0.0000 [IU] | Freq: Three times a day (TID) | SUBCUTANEOUS | Status: DC
  Administered 2019-12-28: 3 [IU] via SUBCUTANEOUS
  Administered 2019-12-28 – 2019-12-29 (×2): 2 [IU] via SUBCUTANEOUS
  Administered 2019-12-29: 3 [IU] via SUBCUTANEOUS

## 2019-12-28 MED ORDER — PANTOPRAZOLE SODIUM 40 MG PO TBEC
40.0000 mg | DELAYED_RELEASE_TABLET | Freq: Every day | ORAL | Status: DC
  Administered 2019-12-28 – 2019-12-29 (×2): 40 mg via ORAL
  Filled 2019-12-28 (×2): qty 1

## 2019-12-28 MED ORDER — VITAMIN D 25 MCG (1000 UNIT) PO TABS
1000.0000 [IU] | ORAL_TABLET | Freq: Every day | ORAL | Status: DC
  Administered 2019-12-28 – 2019-12-29 (×2): 1000 [IU] via ORAL
  Filled 2019-12-28 (×2): qty 1

## 2019-12-28 MED ORDER — APIXABAN 5 MG PO TABS: 5.0000 mg | ORAL_TABLET | Freq: Two times a day (BID) | ORAL | Status: DC

## 2019-12-28 MED ORDER — IRBESARTAN 300 MG PO TABS
300.0000 mg | ORAL_TABLET | Freq: Every day | ORAL | Status: DC
  Administered 2019-12-28 – 2019-12-29 (×2): 300 mg via ORAL
  Filled 2019-12-28 (×2): qty 1

## 2019-12-28 MED ORDER — ONDANSETRON HCL 4 MG PO TABS: 4.0000 mg | ORAL_TABLET | Freq: Four times a day (QID) | ORAL | Status: DC | PRN

## 2019-12-28 MED ORDER — METOPROLOL TARTRATE 50 MG PO TABS
50.0000 mg | ORAL_TABLET | Freq: Two times a day (BID) | ORAL | Status: DC
  Administered 2019-12-28 – 2019-12-29 (×3): 50 mg via ORAL
  Filled 2019-12-28 (×3): qty 1

## 2019-12-28 MED ORDER — LIVING WELL WITH DIABETES BOOK
Freq: Once | Status: AC
  Filled 2019-12-28: qty 1

## 2019-12-28 MED ORDER — PRAVASTATIN SODIUM 40 MG PO TABS
40.0000 mg | ORAL_TABLET | Freq: Every morning | ORAL | Status: DC
  Administered 2019-12-28 – 2019-12-29 (×2): 40 mg via ORAL
  Filled 2019-12-28 (×2): qty 1

## 2019-12-28 MED ORDER — ONDANSETRON HCL 4 MG/2ML IJ SOLN: 4.0000 mg | Freq: Four times a day (QID) | INTRAMUSCULAR | Status: DC | PRN

## 2019-12-28 MED ORDER — UBIQUINOL 100 MG PO CAPS: 100.0000 mg | ORAL_CAPSULE | ORAL | Status: DC

## 2019-12-28 MED ORDER — HEPARIN BOLUS VIA INFUSION
1500.0000 [IU] | Freq: Once | INTRAVENOUS | Status: AC
  Administered 2019-12-28: 1500 [IU] via INTRAVENOUS
  Filled 2019-12-28: qty 1500

## 2019-12-28 MED ORDER — AIRBORNE PO CHEW: 1.0000 | CHEWABLE_TABLET | Freq: Every day | ORAL | Status: DC

## 2019-12-28 MED ORDER — APIXABAN 5 MG PO TABS: 10.0000 mg | ORAL_TABLET | Freq: Two times a day (BID) | ORAL | Status: DC

## 2019-12-28 MED ORDER — ADULT MULTIVITAMIN W/MINERALS CH
1.0000 | ORAL_TABLET | Freq: Every day | ORAL | Status: DC
  Administered 2019-12-28 – 2019-12-29 (×2): 1 via ORAL
  Filled 2019-12-28 (×2): qty 1

## 2019-12-28 NOTE — Progress Notes (Signed)
Lower extremity venous has been completed.   Preliminary results in CV Proc.   Abram Sander 12/28/2019 12:59 PM

## 2019-12-28 NOTE — TOC Initial Note (Signed)
Transition of Care [facility]) - Initial/Assessment Note    Patient Details  Name: Mary Sharp MRN: [id number] Date of Birth: 12-15-1960  Transition of Care [facility]) CM/SW Contact:    Marilu Favre, RN Phone Number: 12/28/2019, 12:40 PM  Clinical Narrative:                  Patient from home with husband. Plan to discharge home on Eliquis, see benefit check note. Also provided patient with 30 free card and $10  co pay card. Patient would like to use [facility] pharmacy. Expected Discharge Plan: Home/Self Care     Patient Goals and CMS Choice Patient states their goals for this hospitalization and ongoing recovery are:: to return to home CMS Medicare.gov Compare Post Acute Care list provided to:: Patient Choice offered to / list presented to : NA  Expected Discharge Plan and Services Expected Discharge Plan: Home/Self Care   Discharge Planning Services: CM Consult, Medication Assistance   Living arrangements for the past 2 months: Single Family Home                 DME Arranged: N/A         HH Arranged: NA          Prior Living Arrangements/Services Living arrangements for the past 2 months: Single Family Home Lives with:: Spouse Patient language and need for interpreter reviewed:: Yes Do you feel safe going back to the place where you live?: Yes      Need for Family Participation in Patient Care: Yes (Comment) Care giver support system in place?: Yes (comment)   Criminal Activity/Legal Involvement Pertinent to Current Situation/Hospitalization: No - Comment as needed  Activities of Daily Living Home Assistive Devices/Equipment: CBG Meter ADL Screening (condition at time of admission) Patient's cognitive ability adequate to safely complete daily activities?: No Is the patient deaf or have difficulty hearing?: No Does the patient have difficulty seeing, even when wearing glasses/contacts?: No Does the patient have difficulty concentrating, remembering, or making  decisions?: No Patient able to express need for assistance with ADLs?: Yes Does the patient have difficulty dressing or bathing?: No Independently performs ADLs?: Yes (appropriate for developmental age) Does the patient have difficulty walking or climbing stairs?: No Weakness of Legs: None Weakness of Arms/Hands: None  Permission Sought/Granted   Permission granted to share information with : No              Emotional Assessment Appearance:: Appears stated age Attitude/Demeanor/Rapport: Engaged Affect (typically observed): Accepting Orientation: : Oriented to Self, Oriented to Place, Oriented to  Time, Oriented to Situation Alcohol / Substance Use: Not Applicable Psych Involvement: No (comment)  Admission diagnosis:  Bilateral pulmonary embolism (HCC) [I26.99] Other acute pulmonary embolism without acute cor pulmonale (HCC) [I26.99] Patient Active Problem List   Diagnosis Date Noted  . Bilateral pulmonary embolism ([place]) 12/28/2019  . Uncontrolled type 2 diabetes mellitus with ketoacidosis without coma, without long-term current use of insulin ([place]) 11/06/2015  . Obesity hypoventilation syndrome ([place]) 11/03/2014  . Neuropathy due to type 2 diabetes mellitus ([place]) 11/03/2014  . Chest pain 12/24/2013  . Hyperlipidemia 12/24/2013  . OSA on CPAP 11/03/2013  . Obesity, morbid ([place]) 11/03/2013  . Morbid obesity with BMI of 45.0-49.9, adult ([place]) 05/05/2013  . Sleep apnea with use of continuous positive airway pressure (CPAP)   . Fibroid   . Ovarian cyst   . Hydrosalpinx   . Acid reflux   . DM2 (diabetes mellitus, type 2) ([place])   .  Hypertension   . MORBID OBESITY 01/22/2010  . Essential hypertension, benign 01/22/2010  . ATRIAL FIBRILLATION 01/22/2010  . GERD 01/22/2010   PCP:  Prince Solian, MD Pharmacy:   [facility] - [address] [place] [address] Phone: [phone] Fax: [place] [id number] - [place], [place] AT [place] [address] Phone: [phone] Fax: [phone]     Social Determinants of Health (SDOH) Interventions    Readmission Risk Interventions No flowsheet data found.

## 2019-12-28 NOTE — ED Notes (Signed)
Medications infusing per MAR. No signs of infiltration.

## 2019-12-28 NOTE — Progress Notes (Signed)
ANTICOAGULATION CONSULT NOTE - Initial Consult  Pharmacy Consult for Heparin>>Apixaban Indication: pulmonary embolus  Allergies  Allergen Reactions  . Codeine Nausea And Vomiting  . Hydrocodone-Acetaminophen Nausea Only  . Propoxyphene N-Acetaminophen Nausea Only  . Morphine Nausea Only and Rash    Patient Measurements: Height: 5\' 4"  (162.6 cm) Weight: 283 lb 11.7 oz (128.7 kg) IBW/kg (Calculated) : 54.7  Vital Signs: Temp: 97.8 F (36.6 C) (02/02 1110) Temp Source: Oral (02/02 1110) BP: 157/60 (02/02 1110) Pulse Rate: 70 (02/02 1110)  Labs: Recent Labs    12/27/19 1629 12/27/19 2245 12/28/19 0346 12/28/19 0957  HGB 11.6*  --  10.6*  --   HCT 40.6  --  36.0  --   PLT 377  --  319  --   HEPARINUNFRC  --   --   --  0.20*  CREATININE 0.75  --  0.62  --   TROPONINIHS 4 4  --   --     Estimated Creatinine Clearance: 99.5 mL/min (by C-G formula based on SCr of 0.62 mg/dL).   Medical History: Past Medical History:  Diagnosis Date  . Acid reflux   . Atrial fibrillation ([place])   . Chest pain   . Diabetes mellitus   . Elevated cholesterol   . Fibroid   . Hydrosalpinx    Right  . Hypertension   . Migraine   . Ovarian cyst    Dermoid-Right   Serous cystadenoma-Left  . Sleep apnea with use of continuous positive airway pressure (CPAP)     02-19-13 , AHI 59.8 , titration to 10 cm water.     Assessment: 59 y/o F with shortness of breath and chest pressure>>CT Angio with new onset PE. To begin heparin. Hgb 11.6. Plts good. Renal function good.  Initial heparin level 0.2 (subtherapeutic), Will rebolus and increase drip for now. Patient to start apixaban per MD tonight. Will continue drip until first dose of apixaban at 2000 and then turn off the drip.   Goal of Therapy:  Heparin level 0.3-0.7 units/ml Monitor platelets by anticoagulation protocol: Yes   Plan:  Heparin 1500 units BOLUS IV x 1 Increase heparin drip to 1450 units/hr D/C heparin drip at 2000 once  first dose of apixaban is given Apixaban 10mg  PO BID x 7 days, then 5mg  BID thereafter.  Daily CBC Monitor for bleeding  Asia Favata A. Levada Dy, PharmD, BCPS, FNKF Clinical Pharmacist [place] Please utilize Amion for appropriate phone number to reach the unit pharmacist ([place])

## 2019-12-28 NOTE — Progress Notes (Signed)
Patient doesn't want CPAP for tonight and will call if she changes her mind. Hope to go home tomorrow

## 2019-12-28 NOTE — Progress Notes (Signed)
PHARMACIST - PHYSICIAN ORDER COMMUNICATION  CONCERNING: P&T Medication Policy on Herbal Medications  DESCRIPTION:  This patient's order for:  Ubiquinol  has been noted.  This product(s) is classified as an "herbal" or natural product. Due to a lack of definitive safety studies or FDA approval, nonstandard manufacturing practices, plus the potential risk of unknown drug-drug interactions while on inpatient medications, the Pharmacy and Therapeutics Committee does not permit the use of "herbal" or natural products of this type within [facility].   ACTION TAKEN: The pharmacy department is unable to verify this order at this time and your patient has been informed of this safety policy. Please reevaluate patient's clinical condition at discharge and address if the herbal or natural product(s) should be resumed at that time.

## 2019-12-28 NOTE — Progress Notes (Signed)
ANTICOAGULATION CONSULT NOTE - Initial Consult  Pharmacy Consult for Heparin Indication: pulmonary embolus  Allergies  Allergen Reactions  . Codeine Nausea And Vomiting  . Hydrocodone-Acetaminophen Nausea Only  . Propoxyphene N-Acetaminophen   . Morphine Nausea Only and Rash    Patient Measurements: Height: 5\' 4"  (162.6 cm) Weight: 280 lb (127 kg) IBW/kg (Calculated) : 54.7  Vital Signs: Temp: 98.6 F (37 C) (02/01 2049) Temp Source: Oral (02/01 2049) BP: 134/75 (02/02 0000) Pulse Rate: 66 (02/02 0000)  Labs: Recent Labs    12/27/19 1629 12/27/19 2245  HGB 11.6*  --   HCT 40.6  --   PLT 377  --   CREATININE 0.75  --   TROPONINIHS 4 4    Estimated Creatinine Clearance: 98.7 mL/min (by C-G formula based on SCr of 0.75 mg/dL).   Medical History: Past Medical History:  Diagnosis Date  . Acid reflux   . Atrial fibrillation ([place])   . Chest pain   . Diabetes mellitus   . Elevated cholesterol   . Fibroid   . Hydrosalpinx    Right  . Hypertension   . Migraine   . Ovarian cyst    Dermoid-Right   Serous cystadenoma-Left  . Sleep apnea with use of continuous positive airway pressure (CPAP)     02-19-13 , AHI 59.8 , titration to 10 cm water.     Assessment: 59 y/o F with shortness of breath and chest pressure>>CT Angio with new onset PE. To begin heparin. Hgb 11.6. Plts good. Renal function good. PTA meds reviewed.   Goal of Therapy:  Heparin level 0.3-0.7 units/ml Monitor platelets by anticoagulation protocol: Yes   Plan:  Heparin 5000 units BOLUS Start heparin drip at 1200 units/hr 1000 HL Daily CBC/HL Monitor for bleeding  Narda Bonds, PharmD, BCPS Clinical Pharmacist Phone: [phone]

## 2019-12-28 NOTE — Discharge Instructions (Signed)
Information on my medicine - ELIQUIS (apixaban)  This medication education was reviewed with me or my healthcare representative as part of my discharge preparation.    Why was Eliquis prescribed for you? Eliquis was prescribed to treat blood clots that may have been found in the veins of your legs (deep vein thrombosis) or in your lungs (pulmonary embolism) and to reduce the risk of them occurring again.  What do You need to know about Eliquis ? The starting dose is 10 mg (two 5 mg tablets) taken TWICE daily for the FIRST SEVEN (7) DAYS, then on 01/04/2020  the dose is reduced to ONE 5 mg tablet taken TWICE daily.  Eliquis may be taken with or without food.   Try to take the dose about the same time in the morning and in the evening. If you have difficulty swallowing the tablet whole please discuss with your pharmacist how to take the medication safely.  Take Eliquis exactly as prescribed and DO NOT stop taking Eliquis without talking to the doctor who prescribed the medication.  Stopping may increase your risk of developing a new blood clot.  Refill your prescription before you run out.  After discharge, you should have regular check-up appointments with your healthcare provider that is prescribing your Eliquis.    What do you do if you miss a dose? If a dose of ELIQUIS is not taken at the scheduled time, take it as soon as possible on the same day and twice-daily administration should be resumed. The dose should not be doubled to make up for a missed dose.  Important Safety Information A possible side effect of Eliquis is bleeding. You should call your healthcare provider right away if you experience any of the following: ? Bleeding from an injury or your nose that does not stop. ? Unusual colored urine (red or dark brown) or unusual colored stools (red or black). ? Unusual bruising for unknown reasons. ? A serious fall or if you hit your head (even if there is no bleeding).  Some  medicines may interact with Eliquis and might increase your risk of bleeding or clotting while on Eliquis. To help avoid this, consult your healthcare provider or pharmacist prior to using any new prescription or non-prescription medications, including herbals, vitamins, non-steroidal anti-inflammatory drugs (NSAIDs) and supplements.  This website has more information on Eliquis (apixaban): http://www.eliquis.com/eliquis/home

## 2019-12-28 NOTE — Progress Notes (Signed)
  Echocardiogram 2D Echocardiogram has been performed.  Jennette Dubin 12/28/2019, 9:46 AM

## 2019-12-28 NOTE — TOC Benefit Eligibility Note (Signed)
Transition of Care [facility]) Benefit Eligibility Note    Patient Details  Name: Mary Sharp MRN: [id number] Date of Birth: [birth year]   Medication/Dose: Arne Cleveland  5 MG BID  Covered?: Yes     Prescription Coverage Preferred Pharmacy: [facility] with Person/Company/Phone Number:: STEPHANIE    @  [employer] [facility]  [id number] # [phone]  Co-Pay: $30.00  Prior Approval: No          Memory Argue Phone Number: 12/28/2019, 11:25 AM

## 2019-12-28 NOTE — Progress Notes (Signed)
59 year old female who was admitted early this morning with a complaint of shortness of breath and was diagnosed with bilateral PE.  Patient seen and examined.  She states that she feels much better.  Breathing has improved.  Complains of bilateral leg pain which is going on for about 2 weeks, right more than the left.  General exam: Appears calm and comfortable  Respiratory system: Clear to auscultation. Respiratory effort normal. Cardiovascular system: S1 & S2 heard, RRR. No JVD, murmurs, rubs, gallops or clicks.  Trace pitting edema bilateral lower extremity.  Bilateral calf tenderness, right more than left. Gastrointestinal system: Abdomen is nondistended, soft and nontender. No organomegaly or masses felt. Normal bowel sounds heard. Central nervous system: Alert and oriented. No focal neurological deficits. Extremities: Symmetric 5 x 5 power. Skin: No rashes, lesions or ulcers.  Psychiatry: Judgement and insight appear normal. Mood & affect appropriate.   Bilateral PE/acute hypoxic respiratory failure: She remains on heparin drip.  Echo to be done when I saw her.  Vascular ultrasound lower extremity Doppler also pending to rule out DVT.  Discussed in length about risk and benefits of anticoagulation and the plan for switching her to Eliquis.  She verbalized understanding all the risk and benefits and agreed with the plan.  Will consult pharmacy to switch her to Eliquis later today.  Will order incentive spirometry and try to wean oxygen down.  We will keep overnight and watch closely.  Potential discharge home tomorrow if she improves.

## 2019-12-28 NOTE — H&P (Addendum)
History and Physical    Mary Sharp [id number] DOB: [birth year] DOA: 12/27/2019  PCP: Prince Solian, MD  Patient coming from: Home  I have personally briefly reviewed patient's old medical records in [facility]  Chief Complaint: SOB  HPI: Mary Sharp is a 59 y.o. female with medical history significant of DM2, HTN, OSA, A.Fib only on ASA 325.  Patient presents to the ED with c/o SOB and palpitations.  She states that she has been having worsening heart palpitations with chest pressure and exertional dyspnea for the past several days.  She states that she has some cough, but no fever.  She was tested for COVID back in December and was negative.  She states that she has had increased swelling on her legs.  Denies any hx of CHF.  She states that her brother has a genetic clotting disorder PAI-1, found after niece had miscarriages.   ED Course: CTA chest shows bilateral PEs without heart strain.  Started on heparin gtt.  BNP nl.  No infiltrates on CTA.   Review of Systems: As per HPI, otherwise all review of systems negative.  Past Medical History:  Diagnosis Date  . Acid reflux   . Atrial fibrillation ([place])   . Chest pain   . Diabetes mellitus   . Elevated cholesterol   . Fibroid   . Hydrosalpinx    Right  . Hypertension   . Migraine   . Ovarian cyst    Dermoid-Right   Serous cystadenoma-Left  . Sleep apnea with use of continuous positive airway pressure (CPAP)     02-19-13 , AHI 59.8 , titration to 10 cm water.     Past Surgical History:  Procedure Laterality Date  . ABDOMINAL SURGERY     Expl. Laparotomy  . GANGLION CYST EXCISION Right [phone]   wrist  . KENALOG INJECTION    . KNEE ARTHROSCOPY Left 08/20/12   torn meniscus  . KNEE SURGERY Right 2004-2005  . knee surgery, arthroscopic      sept 2013 , left knee   . MYOMECTOMY     Multiple  . MYOMECTOMY  [id number]  . OOPHORECTOMY     LSO  . SALPINGECTOMY     Right  . TONSILLECTOMY  1965  .  VAGINAL HYSTERECTOMY  1992   partial     reports that she quit smoking about 24 years ago. She has never used smokeless tobacco. She reports that she does not drink alcohol or use drugs.  Allergies  Allergen Reactions  . Codeine Nausea And Vomiting  . Hydrocodone-Acetaminophen Nausea Only  . Propoxyphene N-Acetaminophen Nausea Only  . Morphine Nausea Only and Rash    Family History  Problem Relation Age of Onset  . Breast cancer Mother        Age 55  . Heart disease Brother   . Coronary artery disease Brother        stent placement  . Hypertension Brother   . Crohn's disease Brother      Prior to Admission medications   Medication Sig Start Date End Date Taking? Authorizing Provider  amLODipine (NORVASC) 10 MG tablet Take 10 mg by mouth every evening.    Yes [provider]  aspirin 325 MG tablet Take 325 mg by mouth every morning.    Yes [provider]  colesevelam (WELCHOL) 625 MG tablet Take 625 mg by mouth daily with breakfast.    Yes [provider]  Empagliflozin-Metformin HCl (SYNJARDY) 12.03-999 MG TABS  Take 1 tablet by mouth 2 (two) times daily.   Yes [provider]  Esomeprazole Magnesium 20 MG TBEC Take 20 mg by mouth every morning.    Yes [provider]  metoprolol (LOPRESSOR) 50 MG tablet Take 50 mg by mouth 2 (two) times daily.   Yes [provider]  pravastatin (PRAVACHOL) 40 MG tablet Take 40 mg by mouth every morning.    Yes [provider]  fexofenadine (ALLEGRA) 180 MG tablet Take 180 mg by mouth every morning.    [provider]  ibuprofen (ADVIL,MOTRIN) 600 MG tablet Take 600 mg by mouth every 6 (six) hours as needed.    [provider]  Multiple Vitamin (MULTIVITAMIN) tablet Take 1 tablet by mouth daily.    [provider]  telmisartan (MICARDIS) 80 MG tablet Take 80 mg by mouth daily. 11/24/19   [provider]    Physical Exam: Vitals:   12/27/19  1620 12/27/19 2049 12/27/19 2220 12/28/19 0000  BP: (!) 188/93 (!) 199/64 133/70 134/75  Pulse: 99 92 70 66  Resp: (!) 22 20 17 19   Temp: 98.6 F (37 C) 98.6 F (37 C)    TempSrc: Oral Oral    SpO2: 93% 93% 93% 96%  Weight:  127 kg    Height:  5\' 4"  (1.626 m)      Constitutional: NAD, calm, comfortable Eyes: PERRL, lids and conjunctivae normal ENMT: Mucous membranes are moist. Posterior pharynx clear of any exudate or lesions.Normal dentition.  Neck: normal, supple, no masses, no thyromegaly Respiratory: clear to auscultation bilaterally, no wheezing, no crackles. Normal respiratory effort. No accessory muscle use.  Cardiovascular: Regular rate and rhythm, no murmurs / rubs / gallops. No extremity edema. 2+ pedal pulses. No carotid bruits.  Abdomen: no tenderness, no masses palpated. No hepatosplenomegaly. Bowel sounds positive.  Musculoskeletal: no clubbing / cyanosis. No joint deformity upper and lower extremities. Good ROM, no contractures. Normal muscle tone.  Skin: no rashes, lesions, ulcers. No induration Neurologic: CN 2-12 grossly intact. Sensation intact, DTR normal. Strength 5/5 in all 4.  Psychiatric: Normal judgment and insight. Alert and oriented x 3. Normal mood.    Labs on Admission: I have personally reviewed following labs and imaging studies  CBC: Recent Labs  Lab 12/27/19 1629  WBC 11.2*  HGB 11.6*  HCT 40.6  MCV 76.6*  PLT [id number]   Basic Metabolic Panel: Recent Labs  Lab 12/27/19 1629  NA 136  K 3.7  CL 97*  CO2 25  GLUCOSE 333*  BUN 16  CREATININE 0.75  CALCIUM 9.1   GFR: Estimated Creatinine Clearance: 98.7 mL/min (by C-G formula based on SCr of 0.75 mg/dL). Liver Function Tests: No results for input(s): AST, ALT, ALKPHOS, BILITOT, PROT, ALBUMIN in the last 168 hours. No results for input(s): LIPASE, AMYLASE in the last 168 hours. No results for input(s): AMMONIA in the last 168 hours. Coagulation Profile: No results for input(s): INR,  PROTIME in the last 168 hours. Cardiac Enzymes: No results for input(s): CKTOTAL, CKMB, CKMBINDEX, TROPONINI in the last 168 hours. BNP (last 3 results) No results for input(s): PROBNP in the last 8760 hours. HbA1C: No results for input(s): HGBA1C in the last 72 hours. CBG: Recent Labs  Lab 12/28/19 0135  GLUCAP 126*   Lipid Profile: No results for input(s): CHOL, HDL, LDLCALC, TRIG, CHOLHDL, LDLDIRECT in the last 72 hours. Thyroid Function Tests: No results for input(s): TSH, T4TOTAL, FREET4, T3FREE, THYROIDAB in the last 72 hours. Anemia  Panel: No results for input(s): VITAMINB12, FOLATE, FERRITIN, TIBC, IRON, RETICCTPCT in the last 72 hours. Urine analysis:    Component Value Date/Time   COLORURINE YELLOW 02/20/2012 [place] 02/20/2012 0943   LABSPEC >1.030 (H) 02/20/2012 0943   PHURINE 5.0 02/20/2012 0943   GLUCOSEU NEG 02/20/2012 0943   HGBUR NEG 02/20/2012 0943   BILIRUBINUR SMALL (A) 02/20/2012 0943   KETONESUR TRACE (A) 02/20/2012 0943   PROTEINUR NEG 02/20/2012 0943   UROBILINOGEN 0.2 02/20/2012 0943   NITRITE NEG 02/20/2012 0943   LEUKOCYTESUR NEG 02/20/2012 0943    Radiological Exams on Admission: DG Chest 2 View  Result Date: 12/27/2019 CLINICAL DATA:  59 year old female with shortness of breath. EXAM: CHEST - 2 VIEW COMPARISON:  Chest radiograph dated 12/17/2013. FINDINGS: The lungs are clear. There is no pleural effusion pneumothorax. Stable cardiac silhouette. No acute osseous pathology. Degenerative changes of the spine. IMPRESSION: No active cardiopulmonary disease. Electronically Signed   By: Anner Crete M.D.   On: 12/27/2019 16:54   CT Angio Chest PE W and/or Wo Contrast  Result Date: 12/28/2019 CLINICAL DATA:  Shortness of breath and chest pain 2 days EXAM: CT ANGIOGRAPHY CHEST WITH CONTRAST TECHNIQUE: Multidetector CT imaging of the chest was performed using the standard protocol during bolus administration of intravenous contrast.  Multiplanar CT image reconstructions and MIPs were obtained to evaluate the vascular anatomy. CONTRAST:  80 mL OMNIPAQUE IOHEXOL 350 MG/ML SOLN COMPARISON:  Chest x-ray from the previous day, nuclear thyroid exam from 2004 and thyroid ultrasound from 2007. FINDINGS: Cardiovascular: Thoracic aorta and its branches show mild calcifications. No aneurysmal dilatation or dissection is seen. No cardiac enlargement is noted. Scattered filling defects are identified within the pulmonary arteries bilaterally consistent with pulmonary emboli. No right heart strain is noted. Mild coronary calcifications are seen. Mediastinum/Nodes: Enlarged thyroid gland with multiple hypodensities are noted. These have been previously shown to represent hyperfunctioning adenomas on nuclear scan from 2004. No sizable hilar or mediastinal adenopathy is. Esophagus as visualized within normal limits. Lungs/Pleura: Lungs are well aerated bilaterally. No focal infiltrate or sizable effusion is seen. Upper Abdomen: Visualized upper abdomen is within normal limits. Musculoskeletal: Degenerative changes of the thoracic spine are noted. No acute bony abnormality is seen. Review of the MIP images confirms the above findings. IMPRESSION: Bilateral pulmonary emboli without evidence of right heart strain. Enlarged thyroid gland with hypodensities shown previously to represent hyperfunctioning adenoma. No other focal abnormality is noted. Aortic Atherosclerosis (ICD10-I70.0). Electronically Signed   By: Inez Catalina M.D.   On: 12/28/2019 01:00    EKG: Independently reviewed.  Assessment/Plan Principal Problem:   Bilateral pulmonary embolism (HCC) Active Problems:   Essential hypertension, benign   ATRIAL FIBRILLATION   DM2 (diabetes mellitus, type 2) (HCC)   Sleep apnea with use of continuous positive airway pressure (CPAP)    1. B PEs - 1. Heparin gtt 2. 2d echo 3. Tele monitor 4. Venous US BLE 5. Suspect she will need to be on  life-long anticoagulation now given she also has A.Fib history. 2. PAF - 1. Cont rate control with metoprolol 2. Stop ASA and use heparin gtt instead 3. CHADS-VASC now elevated given PE, likely will need life long anticoagulation, probably with eliquis or xarelto. 3. HTN - Cont home BP meds 4. OSA - cont CPAP 5. DM2 - 1. Mod scale SSI AC/HS 2. Cont home Degludec 3. Hold home PO hypoglycemics  DVT prophylaxis: Heparin GTT Code Status: Full Family Communication: No  family in room Disposition Plan: Home after admit Consults called: None Admission status: Admit to inpatient  Severity of Illness: The appropriate patient status for this patient is INPATIENT. Inpatient status is judged to be reasonable and necessary in order to provide the required intensity of service to ensure the patient's safety. The patient's presenting symptoms, physical exam findings, and initial radiographic and laboratory data in the context of their chronic comorbidities is felt to place them at high risk for further clinical deterioration. Furthermore, it is not anticipated that the patient will be medically stable for discharge from the hospital within 2 midnights of admission. The following factors support the patient status of inpatient.   IP status for work up and treatment of pulmonary emboli.  * I certify that at the point of admission it is my clinical judgment that the patient will require inpatient hospital care spanning beyond 2 midnights from the point of admission due to high intensity of service, high risk for further deterioration and high frequency of surveillance required.*    Taeshawn Helfman M. DO [facility]  How to contact the [facility] Attending or Consulting provider [place] or covering provider during after hours [place], for this patient?  1. Check the care team in [facility] and look for a) attending/consulting [facility] provider listed and b) the [facility] team listed 2. Log into [email or web address]  Amion  Physician Scheduling and messaging for groups and whole hospitals  On call and physician scheduling software for group practices, residents, hospitalists and other medical providers for call, clinic, rotation and shift schedules. On[employer] is a hospital-wide system for scheduling doctors and paging doctors on call. EasyPlot is for scientific plotting and data analysis.  [email or web address]  and use [place]'s universal password to access. If you do not have the password, please contact the hospital operator.  3. Locate the [facility] provider you are looking for under [facility] and page to a number that you can be directly reached. 4. If you still have difficulty reaching the provider, please page the [facility] (Director on Call) for the Hospitalists listed on amion for assistance.  12/28/2019, 1:57 AM

## 2019-12-28 NOTE — ED Notes (Signed)
Pt to and from CT via cart. Pt conscious, breathing, and A&Ox4. No distress noted.

## 2019-12-28 NOTE — ED Notes (Signed)
RN to RN report given to [facility] with no questions or concerns.

## 2019-12-28 NOTE — Progress Notes (Signed)
Inpatient Diabetes Program Recommendations  AACE/ADA: New Consensus Statement on Inpatient Glycemic Control (2015)  Target Ranges:  Prepandial:   less than 140 mg/dL      Peak postprandial:   less than 180 mg/dL (1-2 hours)      Critically ill patients:  140 - 180 mg/dL   Lab Results  Component Value Date   GLUCAP 149 (H) 12/28/2019   HGBA1C 9.6 (H) 12/28/2019    Review of Glycemic Control  Inpatient Diabetes Program Recommendations:   Spoke with pt about A1C 9.6 (average blood glucose 229 over the past 2-3 months time) and explained what an A1C is, basic pathophysiology of DM Type 2, basic home care, basic diabetes diet nutrition principles, importance of checking CBGs and maintaining good CBG control to prevent long-term and short-term complications. Reviewed signs and symptoms of hyperglycemia and hypoglycemia and how to treat hypoglycemia at home. Also reviewed blood sugar goals at home.  RNs to provide ongoing basic DM education at bedside with this patient. Ordered Living Well With Diabetes book. Patient states her last A1c @ her PCP office was 8.6 in December and was surprised current A1c 9.6. Patient states her weakness is bread but willing to start limiting carbohydrates and sugars. Patient has been working from home and sitting @ her desk without getting up to walk, but states willingness to plan times to walk as permitted by physician during the day and limit sitting long periods of time. Husband @ bedside and states willingness to start walking with patient.  Thank you, Margarita Diekman. Brycen Bean, RN, MSN, CDE  Diabetes Coordinator Inpatient Glycemic Control Team Team Pager [phone] (8am-5pm) 12/28/2019 2:56 PM

## 2019-12-29 DIAGNOSIS — I82461 Acute embolism and thrombosis of right calf muscular vein: Secondary | ICD-10-CM

## 2019-12-29 LAB — CBC WITH DIFFERENTIAL/PLATELET
Abs Immature Granulocytes: 0.04 10*3/uL (ref 0.00–0.07)
Basophils Absolute: 0.1 10*3/uL (ref 0.0–0.1)
Basophils Relative: 1 %
Eosinophils Absolute: 0.3 10*3/uL (ref 0.0–0.5)
Eosinophils Relative: 3 %
HCT: 38.7 % (ref 36.0–46.0)
Hemoglobin: 11.2 g/dL — ABNORMAL LOW (ref 12.0–15.0)
Immature Granulocytes: 1 %
Lymphocytes Relative: 19 %
Lymphs Abs: 1.4 10*3/uL (ref 0.7–4.0)
MCH: 22 pg — ABNORMAL LOW (ref 26.0–34.0)
MCHC: 28.9 g/dL — ABNORMAL LOW (ref 30.0–36.0)
MCV: 76 fL — ABNORMAL LOW (ref 80.0–100.0)
Monocytes Absolute: 0.6 10*3/uL (ref 0.1–1.0)
Monocytes Relative: 8 %
Neutro Abs: 5.1 10*3/uL (ref 1.7–7.7)
Neutrophils Relative %: 68 %
Platelets: 311 10*3/uL (ref 150–400)
RBC: 5.09 MIL/uL (ref 3.87–5.11)
RDW: 16.3 % — ABNORMAL HIGH (ref 11.5–15.5)
WBC: 7.5 10*3/uL (ref 4.0–10.5)
nRBC: 0 % (ref 0.0–0.2)

## 2019-12-29 LAB — GLUCOSE, CAPILLARY
Glucose-Capillary: 156 mg/dL — ABNORMAL HIGH (ref 70–99)
Glucose-Capillary: 128 mg/dL — ABNORMAL HIGH (ref 70–99)

## 2019-12-29 LAB — BASIC METABOLIC PANEL
Anion gap: 16 — ABNORMAL HIGH (ref 5–15)
BUN: 9 mg/dL (ref 6–20)
CO2: 28 mmol/L (ref 22–32)
Calcium: 9.4 mg/dL (ref 8.9–10.3)
Chloride: 97 mmol/L — ABNORMAL LOW (ref 98–111)
Creatinine, Ser: 0.62 mg/dL (ref 0.44–1.00)
GFR calc Af Amer: >60 mL/min (ref >60)
GFR calc non Af Amer: >60 mL/min (ref >60)
Glucose, Bld: 199 mg/dL — ABNORMAL HIGH (ref 70–99)
Potassium: 4.3 mmol/L (ref 3.5–5.1)
Sodium: 141 mmol/L (ref 135–145)

## 2019-12-29 MED ORDER — APIXABAN 5 MG PO TABS: ORAL_TABLET | ORAL | 0 refills | Status: DC

## 2019-12-29 MED FILL — ELIQUIS STARTER PACK 5 MG T: 5 | 30 days supply | Qty: 74 | Fill #0

## 2019-12-29 NOTE — Discharge Summary (Signed)
Physician Discharge Summary  Mary Sharp [id number] DOB: 1961-08-14 DOA: 12/27/2019  PCP: Prince Solian, MD  Admit date: 12/27/2019 Discharge date: 12/29/2019  Admitted From: Home Disposition: Home  Recommendations for Outpatient Follow-up:  1. Follow up with PCP in 1-2 weeks 2. Please obtain BMP/CBC in one week 3. Please follow up on the following pending results:  Home Health: None Equipment/Devices: None  Discharge Condition: Stable CODE STATUS: Full code Diet recommendation: Cardiac  Subjective: Seen and examined.  No complaints.  No shortness of breath or chest pain or any calf pain.   HPI: Mary Sharp is a 59 y.o. female with medical history significant of DM2, HTN, OSA, A.Fib only on ASA 325.  Patient presents to the ED with c/o SOB and palpitations.  She states that she has been having worsening heart palpitations with chest pressure and exertional dyspnea for the past several days. She states that she has some cough, but no fever. She was tested for COVID back in December and was negative. She states that she has had increased swelling on her legs. Denies any hx of CHF. She states that her brother has a genetic clotting disorder PAI-1, found after niece had miscarriages.   ED Course: CTA chest shows bilateral PEs without heart strain.  Started on heparin gtt.  BNP nl.  No infiltrates on CTA.  Brief/Interim Summary: Patient was admitted to hospital service for bilateral PE.  She was started on heparin drip.  Transthoracic echo was obtained which did not show any right heart strain.  Showed mild diastolic congestive heart failure.  She was then diagnosed with right lower extremity DVT.  Subsequently, heparin GTT was switched to Eliquis last night.  Patient's hypoxia improved.  She is not on any oxygen and saturating well over 95% on room air.  Doing well with no symptoms.  She is being discharged in stable condition on Eliquis.  As mentioned in my previous  note, patient is well aware of possible risks and benefits of anticoagulant such as Eliquis.  She has verbalized that and is agreeable with the plan.  Per patient, she had one episode of atrial fibrillation many years ago and she is not aware of any recurrence.  If she has no recurrence, she may need anticoagulant for only 6 months but if there is any evidence of A. fib then she may need this for lifelong.  Will defer this decision to her PCP.  Discharge Diagnoses:  Principal Problem:   Bilateral pulmonary embolism (HCC) Active Problems:   Essential hypertension, benign   PAF (paroxysmal atrial fibrillation) (HCC)   DM2 (diabetes mellitus, type 2) (HCC)   Sleep apnea with use of continuous positive airway pressure (CPAP)   Acute deep vein thrombosis (DVT) of calf muscle vein of right lower extremity [facility])    Discharge Instructions  Discharge Instructions    Discharge patient   Complete by: As directed    Discharge disposition: 01-Home or Self Care   Discharge patient date: 12/29/2019     Allergies as of 12/29/2019      Reactions   Codeine Nausea And Vomiting   Hydrocodone-acetaminophen Nausea Only   Propoxyphene N-acetaminophen Nausea Only   Morphine Nausea Only, Rash      Medication List    TAKE these medications   Airborne Chew Chew 1 tablet by mouth daily.   amLODipine 10 MG tablet Commonly known as: NORVASC Take 10 mg by mouth every evening.   apixaban 5 MG Tabs tablet Commonly known  as: Eliquis Take 2 tablets (10mg ) twice daily for 7 days, then 1 tablet (5mg ) twice daily   cholecalciferol 25 MCG (1000 UNIT) tablet Commonly known as: VITAMIN D3 Take 1,000 Units by mouth daily.   Esomeprazole Magnesium 20 MG Tbec Take 20 mg by mouth every morning.   fexofenadine 180 MG tablet Commonly known as: ALLEGRA Take 180 mg by mouth every morning.   ibuprofen 600 MG tablet Commonly known as: ADVIL Take 600 mg by mouth every 6 (six) hours as needed for headache or  moderate pain.   metoprolol tartrate 50 MG tablet Commonly known as: LOPRESSOR Take 50 mg by mouth 2 (two) times daily.   multivitamin tablet Take 1 tablet by mouth daily.   OcuSoft Eyelid Cleansing Pads Place 1 application into both eyes every morning.   pravastatin 40 MG tablet Commonly known as: PRAVACHOL Take 40 mg by mouth every morning.   Synjardy 12.03-999 MG Tabs Generic drug: Empagliflozin-metFORMIN HCl Take 1 tablet by mouth 2 (two) times daily.   telmisartan 80 MG tablet Commonly known as: MICARDIS Take 80 mg by mouth every morning.   Tyler Aas FlexTouch 100 UNIT/ML Sopn FlexTouch Pen Generic drug: insulin degludec Inject 56 Units into the skin every morning.   Ubiquinol 100 MG Caps Take 100 mg by mouth every morning.   Welchol 625 MG tablet Generic drug: colesevelam Take 625 mg by mouth daily with breakfast.      Follow-up Information    Avva, Ravisankar, MD Follow up in 1 day(s).   Specialty: Internal Medicine Contact information: [place] [address] [phone]          Allergies  Allergen Reactions  . Codeine Nausea And Vomiting  . Hydrocodone-Acetaminophen Nausea Only  . Propoxyphene N-Acetaminophen Nausea Only  . Morphine Nausea Only and Rash    Consultations: None   Procedures/Studies: DG Chest 2 View  Result Date: 12/27/2019 CLINICAL DATA:  59 year old female with shortness of breath. EXAM: CHEST - 2 VIEW COMPARISON:  Chest radiograph dated 12/17/2013. FINDINGS: The lungs are clear. There is no pleural effusion pneumothorax. Stable cardiac silhouette. No acute osseous pathology. Degenerative changes of the spine. IMPRESSION: No active cardiopulmonary disease. Electronically Signed   By: Anner Crete M.D.   On: 12/27/2019 16:54   CT Angio Chest PE W and/or Wo Contrast  Result Date: 12/28/2019 CLINICAL DATA:  Shortness of breath and chest pain 2 days EXAM: CT ANGIOGRAPHY CHEST WITH CONTRAST TECHNIQUE:  Multidetector CT imaging of the chest was performed using the standard protocol during bolus administration of intravenous contrast. Multiplanar CT image reconstructions and MIPs were obtained to evaluate the vascular anatomy. CONTRAST:  80 mL OMNIPAQUE IOHEXOL 350 MG/ML SOLN COMPARISON:  Chest x-ray from the previous day, nuclear thyroid exam from 2004 and thyroid ultrasound from 2007. FINDINGS: Cardiovascular: Thoracic aorta and its branches show mild calcifications. No aneurysmal dilatation or dissection is seen. No cardiac enlargement is noted. Scattered filling defects are identified within the pulmonary arteries bilaterally consistent with pulmonary emboli. No right heart strain is noted. Mild coronary calcifications are seen. Mediastinum/Nodes: Enlarged thyroid gland with multiple hypodensities are noted. These have been previously shown to represent hyperfunctioning adenomas on nuclear scan from 2004. No sizable hilar or mediastinal adenopathy is. Esophagus as visualized within normal limits. Lungs/Pleura: Lungs are well aerated bilaterally. No focal infiltrate or sizable effusion is seen. Upper Abdomen: Visualized upper abdomen is within normal limits. Musculoskeletal: Degenerative changes of the thoracic spine are noted. No acute bony abnormality is  seen. Review of the MIP images confirms the above findings. IMPRESSION: Bilateral pulmonary emboli without evidence of right heart strain. Enlarged thyroid gland with hypodensities shown previously to represent hyperfunctioning adenoma. No other focal abnormality is noted. Aortic Atherosclerosis (ICD10-I70.0). Electronically Signed   By: Inez Catalina M.D.   On: 12/28/2019 01:00   ECHOCARDIOGRAM COMPLETE  Result Date: 12/28/2019   ECHOCARDIOGRAM REPORT   Patient Name:   Mary Sharp Date of Exam: 12/28/2019 Medical Rec #:  [id number]     Height:       64.0 in Accession #:    [id number]    Weight:       283.7 lb Date of Birth:  06-18-1961     BSA:          2.27  m Patient Age:    31 years      BP:           162/74 mmHg Patient Gender: F             HR:           73 bpm. Exam Location:  Inpatient Procedure: 2D Echo Indications:    Pulmonary Embolus I26.99  History:        Patient has no prior history of Echocardiogram examinations.                 Arrythmias:Atrial Fibrillation; Risk Factors:Hypertension,                 Diabetes, Dyslipidemia and Former Smoker.  Sonographer:    Mikki Santee RDCS (AE) Referring Phys: [place]  1. Left ventricular ejection fraction, by visual estimation, is 60 to 65%. The left ventricle has normal function. Left ventricular septal wall thickness was mildly increased. Mildly increased left ventricular posterior wall thickness.  2. The left ventricle has no regional wall motion abnormalities.  3. Left ventricular diastolic parameters are consistent with Grade I diastolic dysfunction (impaired relaxation).  4. Global right ventricle has normal systolic function.The right ventricular size is normal. No increase in right ventricular wall thickness.  5. Left atrial size was normal.  6. Right atrial size was normal.  7. The mitral valve is normal in structure. No evidence of mitral valve regurgitation. No evidence of mitral stenosis.  8. The tricuspid valve is normal in structure. Tricuspid valve regurgitation is not demonstrated.  9. The aortic valve is normal in structure. Aortic valve regurgitation is not visualized. No evidence of aortic valve sclerosis or stenosis. 10. The pulmonic valve was normal in structure. Pulmonic valve regurgitation is not visualized. 11. The inferior vena cava is dilated in size with >50% respiratory variability, suggesting right atrial pressure of 8 mmHg. 12. TR signal is inadequate for assessing pulmonary artery systolic pressure. FINDINGS  Left Ventricle: Left ventricular ejection fraction, by visual estimation, is 60 to 65%. The left ventricle has normal function. The left ventricle  has no regional wall motion abnormalities. Mildly increased left ventricular posterior wall thickness. Left ventricular diastolic parameters are consistent with Grade I diastolic dysfunction (impaired relaxation). Right Ventricle: The right ventricular size is normal. No increase in right ventricular wall thickness. Global RV systolic function is has normal systolic function. Left Atrium: Left atrial size was normal in size. Right Atrium: Right atrial size was normal in size Pericardium: There is no evidence of pericardial effusion. Mitral Valve: The mitral valve is normal in structure. No evidence of mitral valve regurgitation. No evidence of mitral valve stenosis by observation.  Tricuspid Valve: The tricuspid valve is normal in structure. Tricuspid valve regurgitation is not demonstrated. Aortic Valve: The aortic valve is normal in structure. Aortic valve regurgitation is not visualized. The aortic valve is structurally normal, with no evidence of sclerosis or stenosis. Pulmonic Valve: The pulmonic valve was normal in structure. Pulmonic valve regurgitation is not visualized. Pulmonic regurgitation is not visualized. Aorta: The aortic root, ascending aorta and aortic arch are all structurally normal, with no evidence of dilitation or obstruction. Venous: The inferior vena cava is dilated in size with greater than 50% respiratory variability, suggesting right atrial pressure of 8 mmHg. IAS/Shunts: No atrial level shunt detected by color flow Doppler. There is no evidence of a patent foramen ovale. No ventricular septal defect is seen or detected. There is no evidence of an atrial septal defect.  LEFT VENTRICLE PLAX 2D LVIDd:         4.60 cm  Diastology LVIDs:         3.00 cm  LV e' lateral:   7.51 cm/s LV PW:         1.10 cm  LV E/e' lateral: 10.6 LV IVS:        1.20 cm  LV e' medial:    7.72 cm/s LVOT diam:     2.40 cm  LV E/e' medial:  10.3 LV SV:         62 ml LV SV Index:   25.05 LVOT Area:     4.52 cm  RIGHT  VENTRICLE TAPSE (M-mode): 2.5 cm LEFT ATRIUM             Index       RIGHT ATRIUM           Index LA diam:        3.40 cm 1.50 cm/m  RA Area:     11.60 cm LA Vol (A2C):   40.9 ml 18.02 ml/m RA Volume:   21.10 ml  9.30 ml/m LA Vol (A4C):   47.0 ml 20.71 ml/m LA Biplane Vol: 44.3 ml 19.52 ml/m  AORTIC VALVE LVOT Vmax:   115.00 cm/s LVOT Vmean:  73.500 cm/s LVOT VTI:    0.252 m  AORTA Ao Root diam: 3.30 cm MITRAL VALVE MV Area (PHT): 3.53 cm             SHUNTS MV PHT:        62.35 msec           Systemic VTI:  0.25 m MV Decel Time: 215 msec             Systemic Diam: 2.40 cm MV E velocity: 79.30 cm/s 103 cm/s MV A velocity: 83.60 cm/s 70.3 cm/s MV E/A ratio:  0.95       1.5  Fransico Him MD Electronically signed by Fransico Him MD Signature Date/Time: 12/28/2019/1:40:19 PM    Final    VAS [place] LOWER EXTREMITY VENOUS (DVT)  Result Date: 12/28/2019  Lower Venous Study Indications: Pulmonary embolism, and Swelling.  Limitations: Poor ultrasound/tissue interface and body habitus. Comparison Study: no prior Performing Technologist: Abram Sander RVS  Examination Guidelines: A complete evaluation includes B-mode imaging, spectral Doppler, color Doppler, and power Doppler as needed of all accessible portions of each vessel. Bilateral testing is considered an integral part of a complete examination. Limited examinations for reoccurring indications may be performed as noted.  +---------+---------------+---------+-----------+----------+-----------------+ RIGHT    CompressibilityPhasicitySpontaneityPropertiesThrombus Aging    +---------+---------------+---------+-----------+----------+-----------------+ CFV      Full  Yes      Yes                                    +---------+---------------+---------+-----------+----------+-----------------+ SFJ      Full                                                           +---------+---------------+---------+-----------+----------+-----------------+ FV  Prox  Full                                                           +---------+---------------+---------+-----------+----------+-----------------+ FV Mid   Full                                                           +---------+---------------+---------+-----------+----------+-----------------+ FV DistalFull                                                           +---------+---------------+---------+-----------+----------+-----------------+ PFV      Full                                                           +---------+---------------+---------+-----------+----------+-----------------+ POP      None           Yes      Yes                  Age Indeterminate +---------+---------------+---------+-----------+----------+-----------------+ PTV      Full                                                           +---------+---------------+---------+-----------+----------+-----------------+ PERO                                                  Not visualized    +---------+---------------+---------+-----------+----------+-----------------+   +---------+---------------+---------+-----------+----------+--------------+ LEFT     CompressibilityPhasicitySpontaneityPropertiesThrombus Aging +---------+---------------+---------+-----------+----------+--------------+ CFV      Full           Yes      Yes                                 +---------+---------------+---------+-----------+----------+--------------+ SFJ  Full                                                        +---------+---------------+---------+-----------+----------+--------------+ FV Prox  Full                                                        +---------+---------------+---------+-----------+----------+--------------+ FV Mid   Full                                                        +---------+---------------+---------+-----------+----------+--------------+ FV  DistalFull                                                        +---------+---------------+---------+-----------+----------+--------------+ PFV      Full                                                        +---------+---------------+---------+-----------+----------+--------------+ POP      Full           Yes      Yes                                 +---------+---------------+---------+-----------+----------+--------------+ PTV      Full                                                        +---------+---------------+---------+-----------+----------+--------------+ PERO                                                  Not visualized +---------+---------------+---------+-----------+----------+--------------+     Summary: Right: Findings consistent with age indeterminate deep vein thrombosis involving the right popliteal vein. No cystic structure found in the popliteal fossa. Left: There is no evidence of deep vein thrombosis in the lower extremity. No cystic structure found in the popliteal fossa.  *See table(s) above for measurements and observations. Electronically signed by Harold Barban MD on 12/28/2019 at 3:19:37 PM.    Final       Discharge Exam: Vitals:   12/28/19 2339 12/29/19 0553  BP: 139/68 (!) 146/72  Pulse: 74 67  Resp: 18 18  Temp: 97.9 F (36.6 C) 97.9 F (36.6 C)  SpO2: 98% 97%   Vitals:   12/28/19 1110 12/28/19 2144 12/28/19 2339 12/29/19 [id number]  BP: (!) 157/60 (!) 148/65 139/68 (!) 146/72  Pulse: 70 75 74 67  Resp: 20 16 18 18   Temp: 97.8 F (36.6 C)  97.9 F (36.6 C) 97.9 F (36.6 C)  TempSrc: Oral  Oral Oral  SpO2: 98% 96% 98% 97%  Weight:      Height:        General: Pt is alert, awake, not in acute distress Cardiovascular: RRR, S1/S2 +, no rubs, no gallops Respiratory: CTA bilaterally, no wheezing, no rhonchi Abdominal: Soft, NT, ND, bowel sounds + Extremities: Trace pitting edema bilateral lower extremity, no cyanosis, mild  right calf tenderness    The results of significant diagnostics from this hospitalization (including imaging, microbiology, ancillary and laboratory) are listed below for reference.     Microbiology: Recent Results (from the past 240 hour(s))  Respiratory Panel by RT PCR (Flu A&B, Covid) - Nasopharyngeal Swab     Status: None   Collection Time: 12/28/19  1:11 AM   Specimen: Nasopharyngeal Swab  Result Value Ref Range Status   SARS Coronavirus 2 by RT PCR NEGATIVE NEGATIVE Final    Comment: (NOTE) SARS-CoV-2 target nucleic acids are NOT DETECTED. The SARS-CoV-2 RNA is generally detectable in upper respiratoy specimens during the acute phase of infection. The lowest concentration of SARS-CoV-2 viral copies this assay can detect is 131 copies/mL. A negative result does not preclude SARS-Cov-2 infection and should not be used as the sole basis for treatment or other patient management decisions. A negative result may occur with  improper specimen collection/handling, submission of specimen other than nasopharyngeal swab, presence of viral mutation(s) within the areas targeted by this assay, and inadequate number of viral copies (<131 copies/mL). A negative result must be combined with clinical observations, patient history, and epidemiological information. The expected result is Negative. Fact Sheet for Patients:  PinkCheek.be Fact Sheet for Healthcare Providers:  GravelBags.it This test is not yet ap proved or cleared by the [place] FDA and  has been authorized for detection and/or diagnosis of SARS-CoV-2 by FDA under an Emergency Use Authorization (EUA). This EUA will remain  in effect (meaning this test can be used) for the duration of the COVID-19 declaration under Section 564(b)(1) of the Act, 21 U.S.C. section 360bbb-3(b)(1), unless the authorization is terminated or revoked sooner.    Influenza A by PCR NEGATIVE  NEGATIVE Final   Influenza B by PCR NEGATIVE NEGATIVE Final    Comment: (NOTE) The Xpert Xpress SARS-CoV-2/FLU/RSV assay is intended as an aid in  the diagnosis of influenza from Nasopharyngeal swab specimens and  should not be used as a sole basis for treatment. Nasal washings and  aspirates are unacceptable for Xpert Xpress SARS-CoV-2/FLU/RSV  testing. Fact Sheet for Patients: PinkCheek.be Fact Sheet for Healthcare Providers: GravelBags.it This test is not yet approved or cleared by the [place] FDA and  has been authorized for detection and/or diagnosis of SARS-CoV-2 by  FDA under an Emergency Use Authorization (EUA). This EUA will remain  in effect (meaning this test can be used) for the duration of the  Covid-19 declaration under Section 564(b)(1) of the Act, 21  U.S.C. section 360bbb-3(b)(1), unless the authorization is  terminated or revoked. Performed at [facility], [place] [address] [place], [place] [address]      Labs: BNP (last 3 results) Recent Labs    12/27/19 2315  BNP 0000000   Basic Metabolic Panel: Recent Labs  Lab 12/27/19 1629 12/28/19 0346 12/29/19 0708  NA 136 139  141  K 3.7 3.7 4.3  CL 97* 102 97*  CO2 25 27 28   GLUCOSE 333* 120* 199*  BUN 16 11 9   CREATININE 0.75 0.62 0.62  CALCIUM 9.1 8.9 9.4   Liver Function Tests: No results for input(s): AST, ALT, ALKPHOS, BILITOT, PROT, ALBUMIN in the last 168 hours. No results for input(s): LIPASE, AMYLASE in the last 168 hours. No results for input(s): AMMONIA in the last 168 hours. CBC: Recent Labs  Lab 12/27/19 1629 12/28/19 0346 12/29/19 0708  WBC 11.2* 9.2 7.5  NEUTROABS  --   --  5.1  HGB 11.6* 10.6* 11.2*  HCT 40.6 36.0 38.7  MCV 76.6* 75.2* 76.0*  PLT 377 319 311   Cardiac Enzymes: No results for input(s): CKTOTAL, CKMB, CKMBINDEX, TROPONINI in the last 168 hours. BNP: Invalid input(s): POCBNP CBG: Recent Labs  Lab  12/28/19 0628 12/28/19 1112 12/28/19 1609 12/28/19 2122 12/29/19 0609  GLUCAP 109* 149* 164* 182* 156*   D-Dimer Recent Labs    12/27/19 2315  DDIMER 2.79*   Hgb A1c Recent Labs    12/28/19 0346  HGBA1C 9.6*   Lipid Profile No results for input(s): CHOL, HDL, LDLCALC, TRIG, CHOLHDL, LDLDIRECT in the last 72 hours. Thyroid function studies Recent Labs    12/28/19 0346  TSH 0.361   Anemia work up No results for input(s): VITAMINB12, FOLATE, FERRITIN, TIBC, IRON, RETICCTPCT in the last 72 hours. Urinalysis    Component Value Date/Time   COLORURINE YELLOW 02/20/2012 [place] 02/20/2012 0943   LABSPEC >1.030 (H) 02/20/2012 0943   PHURINE 5.0 02/20/2012 0943   GLUCOSEU NEG 02/20/2012 0943   HGBUR NEG 02/20/2012 0943   BILIRUBINUR SMALL (A) 02/20/2012 0943   KETONESUR TRACE (A) 02/20/2012 0943   PROTEINUR NEG 02/20/2012 0943   UROBILINOGEN 0.2 02/20/2012 0943   NITRITE NEG 02/20/2012 0943   LEUKOCYTESUR NEG 02/20/2012 0943   Sepsis Labs Invalid input(s): PROCALCITONIN,  WBC,  LACTICIDVEN Microbiology Recent Results (from the past 240 hour(s))  Respiratory Panel by RT PCR (Flu A&B, Covid) - Nasopharyngeal Swab     Status: None   Collection Time: 12/28/19  1:11 AM   Specimen: Nasopharyngeal Swab  Result Value Ref Range Status   SARS Coronavirus 2 by RT PCR NEGATIVE NEGATIVE Final    Comment: (NOTE) SARS-CoV-2 target nucleic acids are NOT DETECTED. The SARS-CoV-2 RNA is generally detectable in upper respiratoy specimens during the acute phase of infection. The lowest concentration of SARS-CoV-2 viral copies this assay can detect is 131 copies/mL. A negative result does not preclude SARS-Cov-2 infection and should not be used as the sole basis for treatment or other patient management decisions. A negative result may occur with  improper specimen collection/handling, submission of specimen other than nasopharyngeal swab, presence of viral  mutation(s) within the areas targeted by this assay, and inadequate number of viral copies (<131 copies/mL). A negative result must be combined with clinical observations, patient history, and epidemiological information. The expected result is Negative. Fact Sheet for Patients:  PinkCheek.be Fact Sheet for Healthcare Providers:  GravelBags.it This test is not yet ap proved or cleared by the [place] FDA and  has been authorized for detection and/or diagnosis of SARS-CoV-2 by FDA under an Emergency Use Authorization (EUA). This EUA will remain  in effect (meaning this test can be used) for the duration of the COVID-19 declaration under Section 564(b)(1) of the Act, 21 U.S.C. section 360bbb-3(b)(1), unless the authorization is terminated or revoked sooner.  Influenza A by PCR NEGATIVE NEGATIVE Final   Influenza B by PCR NEGATIVE NEGATIVE Final    Comment: (NOTE) The Xpert Xpress SARS-CoV-2/FLU/RSV assay is intended as an aid in  the diagnosis of influenza from Nasopharyngeal swab specimens and  should not be used as a sole basis for treatment. Nasal washings and  aspirates are unacceptable for Xpert Xpress SARS-CoV-2/FLU/RSV  testing. Fact Sheet for Patients: PinkCheek.be Fact Sheet for Healthcare Providers: GravelBags.it This test is not yet approved or cleared by the [place] FDA and  has been authorized for detection and/or diagnosis of SARS-CoV-2 by  FDA under an Emergency Use Authorization (EUA). This EUA will remain  in effect (meaning this test can be used) for the duration of the  Covid-19 declaration under Section 564(b)(1) of the Act, 21  U.S.C. section 360bbb-3(b)(1), unless the authorization is  terminated or revoked. Performed at [facility], [place] [address] [place], [place] [address]      Time coordinating discharge: Over 30  minutes  SIGNED:   Darliss Cheney, MD  [facility] 12/29/2019, 8:58 AM  If 7PM-7AM, please contact night-coverage [email or web address]

## 2020-01-05 DIAGNOSIS — G4733 Obstructive sleep apnea (adult) (pediatric): Secondary | ICD-10-CM | POA: Diagnosis not present

## 2020-01-05 DIAGNOSIS — I2699 Other pulmonary embolism without acute cor pulmonale: Secondary | ICD-10-CM | POA: Diagnosis not present

## 2020-01-05 DIAGNOSIS — I10 Essential (primary) hypertension: Secondary | ICD-10-CM | POA: Diagnosis not present

## 2020-02-03 ENCOUNTER — Encounter: Payer: Self-pay | Admitting: Cardiology

## 2020-02-03 ENCOUNTER — Telehealth (INDEPENDENT_AMBULATORY_CARE_PROVIDER_SITE_OTHER): Payer: BC Managed Care – PPO | Admitting: Cardiology

## 2020-02-03 VITALS — Temp 98.4°F | Ht 64.0 in | Wt 270.0 lb

## 2020-02-03 DIAGNOSIS — I82461 Acute embolism and thrombosis of right calf muscular vein: Secondary | ICD-10-CM

## 2020-02-03 DIAGNOSIS — I10 Essential (primary) hypertension: Secondary | ICD-10-CM

## 2020-02-03 DIAGNOSIS — E119 Type 2 diabetes mellitus without complications: Secondary | ICD-10-CM

## 2020-02-03 DIAGNOSIS — Z9989 Dependence on other enabling machines and devices: Secondary | ICD-10-CM

## 2020-02-03 DIAGNOSIS — I2699 Other pulmonary embolism without acute cor pulmonale: Secondary | ICD-10-CM

## 2020-02-03 DIAGNOSIS — Z7901 Long term (current) use of anticoagulants: Secondary | ICD-10-CM

## 2020-02-03 DIAGNOSIS — I480 Paroxysmal atrial fibrillation: Secondary | ICD-10-CM

## 2020-02-03 DIAGNOSIS — Z6841 Body Mass Index (BMI) 40.0 and over, adult: Secondary | ICD-10-CM

## 2020-02-03 DIAGNOSIS — G4733 Obstructive sleep apnea (adult) (pediatric): Secondary | ICD-10-CM

## 2020-02-03 NOTE — Patient Instructions (Signed)
Medication Instructions:  Your physician recommends that you continue on your current medications as directed. Please refer to the Current Medication list given to you today.  *If you need a refill on your cardiac medications before your next appointment, please call your pharmacy*   Follow-Up: At [facility], you and your health needs are our priority.  As part of our continuing mission to provide you with exceptional heart care, we have created designated Provider Care Teams.  These Care Teams include your primary Cardiologist (physician) and Advanced Practice Providers (APPs -  Physician Assistants and Nurse Practitioners) who all work together to provide you with the care you need, when you need it.  We recommend signing up for the patient portal called "MyChart".  Sign up information is provided on this After Visit Summary.  MyChart is used to connect with patients for Virtual Visits (Telemedicine).  Patients are able to view lab/test results, encounter notes, upcoming appointments, etc.  Non-urgent messages can be sent to your provider as well.   To learn more about what you can do with MyChart, go to [email or web address].    Your next appointment:   5 month(s)  The format for your next appointment:   In Person  Provider:   Kirk Ruths, MD   Other Instructions Our office will contact you to schedule your follow-up appointment with Dr. Stanford Breed.

## 2020-02-03 NOTE — Progress Notes (Signed)
Virtual Visit via Video Note   This visit type was conducted due to national recommendations for restrictions regarding the COVID-19 Pandemic (e.g. social distancing) in an effort to limit this patient's exposure and mitigate transmission in our community.  Due to her co-morbid illnesses, this patient is at least at moderate risk for complications without adequate follow up.  This format is felt to be most appropriate for this patient at this time.  All issues noted in this document were discussed and addressed.  A limited physical exam was performed with this format.  Please refer to the patient's chart for her consent to telehealth for [facility].   Due to technical difficulties this was changed to a telephone visit.  The patient was identified using 2 identifiers.  Date:  02/03/2020   ID:  Mary Sharp, DOB [birth year], MRN [id number]  Patient Location: Home Provider Location: Home  PCP:  Prince Solian, MD  Cardiologist:  Kirk Ruths, MD  Electrophysiologist:  None   Evaluation Performed:  Follow-Up Visit  Chief Complaint:  Post hospital f/u  History of Present Illness:    Mary Sharp is a 59 y.o. female with a hx of remote atrial fibrillation in 2010.  She had a low risk Myoview in 2015.  Other medical issues include essential hypertension, non-insulin-dependent diabetes, and dyslipidemia.  Dr. Stanford Breed had discussed anticoagulation with the patient in the past but she declined and takes an aspirin a day.  Her last office visit with [place] was 2018.  The patient presented to the hospital February 2021 with shortness of breath and tachycardia.  She ruled in for bilateral pulmonary embolism and also had a right lower extremity DVT.  The patient says that she has been working from home and has been sitting for several hours a day at the computer.  She is also morbidly obese.  She also has a family history of some type of clotting disorder.  She was placed on Eliquis and  discharged.  She was instructed to follow-up with [place].  Since discharge she has been doing well.  She denies any history of tachycardia or palpitations recently and her hospital records do not document any atrial fibrillation.  The patient does not have symptoms concerning for COVID-19 infection (fever, chills, cough, or new shortness of breath).    Past Medical History:  Diagnosis Date  . Acid reflux   . Atrial fibrillation ([place])   . Chest pain   . Diabetes mellitus   . Elevated cholesterol   . Fibroid   . Hydrosalpinx    Right  . Hypertension   . Migraine   . Ovarian cyst    Dermoid-Right   Serous cystadenoma-Left  . Sleep apnea with use of continuous positive airway pressure (CPAP)     02-19-13 , AHI 59.8 , titration to 10 cm water.    Past Surgical History:  Procedure Laterality Date  . ABDOMINAL SURGERY     Expl. Laparotomy  . GANGLION CYST EXCISION Right [phone]   wrist  . KENALOG INJECTION    . KNEE ARTHROSCOPY Left 08/20/12   torn meniscus  . KNEE SURGERY Right 2004-2005  . knee surgery, arthroscopic      sept 2013 , left knee   . MYOMECTOMY     Multiple  . MYOMECTOMY  [id number]  . OOPHORECTOMY     LSO  . SALPINGECTOMY     Right  . TONSILLECTOMY  1965  . VAGINAL HYSTERECTOMY  1992   partial  Current Meds  Medication Sig  . amLODipine (NORVASC) 10 MG tablet Take 10 mg by mouth every evening.   Marland Kitchen apixaban (ELIQUIS) 5 MG TABS tablet Take 2 tablets (10mg ) twice daily for 7 days, then 1 tablet (5mg ) twice daily  . cholecalciferol (VITAMIN D3) 25 MCG (1000 UNIT) tablet Take 1,000 Units by mouth daily.  . colesevelam (WELCHOL) 625 MG tablet Take 625 mg by mouth daily with breakfast.   . Empagliflozin-Metformin HCl (SYNJARDY) 12.03-999 MG TABS Take 1 tablet by mouth 2 (two) times daily.  . Eyelid Cleansers (OCUSOFT EYELID CLEANSING) PADS Place 1 application into both eyes every morning.  . fexofenadine (ALLEGRA) 180 MG tablet Take 180 mg by mouth every  morning.  . insulin degludec (TRESIBA FLEXTOUCH) 100 UNIT/ML SOPN FlexTouch Pen Inject 59 Units into the skin every morning.   . metoprolol (LOPRESSOR) 50 MG tablet Take 50 mg by mouth 2 (two) times daily.  . Multiple Vitamin (MULTIVITAMIN) tablet Take 1 tablet by mouth daily.  . Multiple Vitamins-Minerals (AIRBORNE) CHEW Chew 1 tablet by mouth daily.  . pantoprazole (PROTONIX) 40 MG tablet Take 40 mg by mouth daily.  . pravastatin (PRAVACHOL) 40 MG tablet Take 40 mg by mouth every morning.   Marland Kitchen telmisartan (MICARDIS) 80 MG tablet Take 80 mg by mouth 2 (two) times daily.   Marland Kitchen Ubiquinol 100 MG CAPS Take 100 mg by mouth every morning.     Allergies:   Codeine, Hydrocodone-acetaminophen, Propoxyphene n-acetaminophen, and Morphine   Social History   Tobacco Use  . Smoking status: Former Smoker    Quit date: 11/25/1995    Years since quitting: 24.2  . Smokeless tobacco: Never Used  Substance Use Topics  . Alcohol use: No  . Drug use: No     Family Hx: The patient's family history includes Breast cancer in her mother; Clotting disorder in her brother; Coronary artery disease in her brother; Crohn's disease in her brother; Heart disease in her brother; Hypertension in her brother.  ROS:   Please see the history of present illness.    All other systems reviewed and are negative.   Prior CV studies:   The following studies were reviewed today: Echo EF 60-65%, mild LVH  Labs/Other Tests and Data Reviewed:    EKG:  An ECG dated 01/03/2020 was personally reviewed today and demonstrated:  NST-ST 100, poor anterior RW  Recent Labs: 12/27/2019: B Natriuretic Peptide 43.0 12/28/2019: TSH 0.361 12/29/2019: BUN 9; Creatinine, Ser 0.62; Hemoglobin 11.2; Platelets 311; Potassium 4.3; Sodium 141   Recent Lipid Panel No results found for: CHOL, TRIG, HDL, CHOLHDL, LDLCALC, LDLDIRECT  Wt Readings from Last 3 Encounters:  02/03/20 270 lb (122.5 kg)  12/28/19 283 lb 11.7 oz (128.7 kg)  01/28/17 263  lb 3.2 oz (119.4 kg)     Objective:    Vital Signs:  Temp 98.4 F (36.9 C)   Ht 5\' 4"  (1.626 m)   Wt 270 lb (122.5 kg)   BMI 46.35 kg/m    VITAL SIGNS:  reviewed  ASSESSMENT & PLAN:    Bilateral PE/RLE DVT- I suggested there patient contact her PCP about hypercoagulable work up- this may affect recommendations for duration of her AC therapy.   PAF- Remote without recurrence as far as we know. Before her PE she was a CHADS VASC=3 and declined AC.   Anticoagulated- She is on Eliquis for PE, hospital chart suggests possible 6 months of therapy but this may need to be continued indefinitely.  HTN- Controlled  NIDDM- Per PCP  HLD- On statin Rx  Morbid obesity- BMI 46  Plan: Contact PCP about further work up for possible clotting disorder.  F/U Dr Stanford Breed in 5 months.     COVID-19 Education: The signs and symptoms of COVID-19 were discussed with the patient and how to seek care for testing (follow up with PCP or arrange E-visit).  The importance of social distancing was discussed today.  Time:   Today, I have spent 20 minutes with the patient with telehealth technology discussing the above problems.     Medication Adjustments/Labs and Tests Ordered: Current medicines are reviewed at length with the patient today.  Concerns regarding medicines are outlined above.   Tests Ordered: No orders of the defined types were placed in this encounter.   Medication Changes: No orders of the defined types were placed in this encounter.   Follow Up:  In Person Dr Stanford Breed in 5 months  Signed, Kerin Ransom, Hershal Coria  02/03/2020 10:55 AM    [place]

## 2020-02-26 ENCOUNTER — Ambulatory Visit: Payer: BC Managed Care – PPO | Attending: Internal Medicine

## 2020-02-26 DIAGNOSIS — Z23 Encounter for immunization: Secondary | ICD-10-CM

## 2020-02-26 NOTE — Progress Notes (Signed)
   Covid-19 Vaccination Clinic  Name:  Mary Sharp    MRN: [id number] DOB: 01/30/1961  02/26/2020  [name] was observed post Covid-19 immunization for 15 minutes without incident. She was provided with Vaccine Information Sheet and instruction to access the V-Safe system.   [name] was instructed to call 911 with any severe reactions post vaccine: Marland Kitchen Difficulty breathing  . Swelling of face and throat  . A fast heartbeat  . A bad rash all over body  . Dizziness and weakness   Immunizations Administered    Name Date Dose VIS Date Route   Pfizer COVID-19 Vaccine 02/26/2020  9:23 AM 0.3 mL 11/05/2019 Intramuscular   Manufacturer: [place]   Lot: [id number]   [place]: [id number]

## 2020-03-02 DIAGNOSIS — G4733 Obstructive sleep apnea (adult) (pediatric): Secondary | ICD-10-CM | POA: Diagnosis not present

## 2020-03-21 ENCOUNTER — Ambulatory Visit: Payer: BC Managed Care – PPO | Attending: Internal Medicine

## 2020-03-21 DIAGNOSIS — Z23 Encounter for immunization: Secondary | ICD-10-CM

## 2020-03-21 NOTE — Progress Notes (Signed)
   Covid-19 Vaccination Clinic  Name:  Mary Sharp    MRN: [id number] DOB: June 17, 1961  03/21/2020  [name] was observed post Covid-19 immunization for 15 minutes without incident. She was provided with Vaccine Information Sheet and instruction to access the V-Safe system.   [name] was instructed to call 911 with any severe reactions post vaccine: Marland Kitchen Difficulty breathing  . Swelling of face and throat  . A fast heartbeat  . A bad rash all over body  . Dizziness and weakness   Immunizations Administered    Name Date Dose VIS Date Route   Pfizer COVID-19 Vaccine 03/21/2020  4:08 PM 0.3 mL 01/19/2019 Intramuscular   Manufacturer: [place]   Lot: [id number]   [place]: [id number]

## 2020-05-12 DIAGNOSIS — M25551 Pain in right hip: Secondary | ICD-10-CM | POA: Diagnosis not present

## 2020-06-09 DIAGNOSIS — M545 Low back pain: Secondary | ICD-10-CM | POA: Diagnosis not present

## 2020-06-09 DIAGNOSIS — M25551 Pain in right hip: Secondary | ICD-10-CM | POA: Diagnosis not present

## 2020-06-16 DIAGNOSIS — G4733 Obstructive sleep apnea (adult) (pediatric): Secondary | ICD-10-CM | POA: Diagnosis not present

## 2020-06-29 ENCOUNTER — Telehealth: Payer: Self-pay | Admitting: *Deleted

## 2020-06-29 NOTE — Telephone Encounter (Signed)
A detailed message was left, re: her follow up visit. 

## 2020-07-22 DIAGNOSIS — R070 Pain in throat: Secondary | ICD-10-CM | POA: Diagnosis not present

## 2020-07-22 DIAGNOSIS — Z20828 Contact with and (suspected) exposure to other viral communicable diseases: Secondary | ICD-10-CM | POA: Diagnosis not present

## 2020-07-22 DIAGNOSIS — J309 Allergic rhinitis, unspecified: Secondary | ICD-10-CM | POA: Diagnosis not present

## 2020-07-22 DIAGNOSIS — J019 Acute sinusitis, unspecified: Secondary | ICD-10-CM | POA: Diagnosis not present

## 2020-09-13 NOTE — Progress Notes (Deleted)
HPI: FU chest pain. Patient seen previously by Dr. Lovena Le for isolated episode of paroxysmal atrial fibrillation in Oct 2010. Echocardiogram in October of 2010 showed normal LV function. Nuclear study Feb 2015 showed EF 71 and normal perfusion. Had PE 2/21.   CTA February 2021 showed bilateral pulmonary emboli.  Echocardiogram February 2021 showed normal LV function, grade 1 diastolic dysfunction.  Lower extremity venous Dopplers February 2021 showed age-indeterminate DVT right popliteal vein. Since she was last seen,   Current Outpatient Medications  Medication Sig Dispense Refill  . amLODipine (NORVASC) 10 MG tablet Take 10 mg by mouth every evening.     Marland Kitchen apixaban (ELIQUIS) 5 MG TABS tablet Take 2 tablets (10mg ) twice daily for 7 days, then 1 tablet (5mg ) twice daily 60 tablet 0  . cholecalciferol (VITAMIN D3) 25 MCG (1000 UNIT) tablet Take 1,000 Units by mouth daily.    . colesevelam (WELCHOL) 625 MG tablet Take 625 mg by mouth daily with breakfast.     . Empagliflozin-Metformin HCl (SYNJARDY) 12.03-999 MG TABS Take 1 tablet by mouth 2 (two) times daily.    . Eyelid Cleansers (OCUSOFT EYELID CLEANSING) PADS Place 1 application into both eyes every morning.    . fexofenadine (ALLEGRA) 180 MG tablet Take 180 mg by mouth every morning.    . insulin degludec (TRESIBA FLEXTOUCH) 100 UNIT/ML SOPN FlexTouch Pen Inject 59 Units into the skin every morning.     . metoprolol (LOPRESSOR) 50 MG tablet Take 50 mg by mouth 2 (two) times daily.    . Multiple Vitamin (MULTIVITAMIN) tablet Take 1 tablet by mouth daily.    . Multiple Vitamins-Minerals (AIRBORNE) CHEW Chew 1 tablet by mouth daily.    . pantoprazole (PROTONIX) 40 MG tablet Take 40 mg by mouth daily.    . pravastatin (PRAVACHOL) 40 MG tablet Take 40 mg by mouth every morning.     Marland Kitchen telmisartan (MICARDIS) 80 MG tablet Take 80 mg by mouth 2 (two) times daily.     Marland Kitchen Ubiquinol 100 MG CAPS Take 100 mg by mouth every morning.     No current  facility-administered medications for this visit.     Past Medical History:  Diagnosis Date  . Acid reflux   . Atrial fibrillation ([place])   . Chest pain   . Diabetes mellitus   . Elevated cholesterol   . Fibroid   . Hydrosalpinx    Right  . Hypertension   . Migraine   . Ovarian cyst    Dermoid-Right   Serous cystadenoma-Left  . Sleep apnea with use of continuous positive airway pressure (CPAP)     02-19-13 , AHI 59.8 , titration to 10 cm water.     Past Surgical History:  Procedure Laterality Date  . ABDOMINAL SURGERY     Expl. Laparotomy  . GANGLION CYST EXCISION Right [phone]   wrist  . KENALOG INJECTION    . KNEE ARTHROSCOPY Left 08/20/12   torn meniscus  . KNEE SURGERY Right 2004-2005  . knee surgery, arthroscopic      sept 2013 , left knee   . MYOMECTOMY     Multiple  . MYOMECTOMY  [id number]  . OOPHORECTOMY     LSO  . SALPINGECTOMY     Right  . TONSILLECTOMY  1965  . VAGINAL HYSTERECTOMY  1992   partial    Social History   Socioeconomic History  . Marital status: Married    Spouse name: Not on file  .  Number of children: 3  . Years of education: 17  . Highest education level: Not on file  Occupational History    Employer: [employer]  Tobacco Use  . Smoking status: Former Smoker    Quit date: 11/25/1995    Years since quitting: 24.8  . Smokeless tobacco: Never Used  Substance and Sexual Activity  . Alcohol use: No  . Drug use: No  . Sexual activity: Never    Birth control/protection: Surgical  Other Topics Concern  . Not on file  Social History Narrative   Right handed.  Caffeine 2 cups day.  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  Married, 2 bio kids, 1 step   Social Determinants of Radio broadcast assistant Strain:   . Difficulty of Paying Living Expenses: Not on file  Food Insecurity:   . Worried About Charity fundraiser in the Last Year: Not on file  . Ran Out of Food in the Last Year: Not on file  Transportation Needs:   .  Lack of Transportation (Medical): Not on file  . Lack of Transportation (Non-Medical): Not on file  Physical Activity:   . Days of Exercise per Week: Not on file  . Minutes of Exercise per Session: Not on file  Stress:   . Feeling of Stress : Not on file  Social Connections:   . Frequency of Communication with Friends and Family: Not on file  . Frequency of Social Gatherings with Friends and Family: Not on file  . Attends Religious Services: Not on file  . Active Member of Clubs or Organizations: Not on file  . Attends Archivist Meetings: Not on file  . Marital Status: Not on file  Intimate Partner Violence:   . Fear of Current or Ex-Partner: Not on file  . Emotionally Abused: Not on file  . Physically Abused: Not on file  . Sexually Abused: Not on file    Family History  Problem Relation Age of Onset  . Breast cancer Mother        Age 32  . Heart disease Brother   . Coronary artery disease Brother        stent placement  . Hypertension Brother   . Crohn's disease Brother   . Clotting disorder Brother     ROS: no fevers or chills, productive cough, hemoptysis, dysphasia, odynophagia, melena, hematochezia, dysuria, hematuria, rash, seizure activity, orthopnea, PND, pedal edema, claudication. Remaining systems are negative.  Physical Exam: Well-developed well-nourished in no acute distress.  Skin is warm and dry.  HEENT is normal.  Neck is supple.  Chest is clear to auscultation with normal expansion.  Cardiovascular exam is regular rate and rhythm.  Abdominal exam nontender or distended. No masses palpated. Extremities show no edema. neuro grossly intact  ECG- personally reviewed  A/P  1 atrial fibrillation-as outlined in previous notes patient had one documented episode of atrial fibrillation in 2010 with no recurrences that we know of.  I think given recent pulmonary embolus and history of atrial fibrillation we should continue anticoagulation long-term.   Continue apixaban.  2 pulmonary embolus-as outlined above plan to continue apixaban indefinitely.  3 hypertension-blood pressure controlled.  Continue present medical regimen.  4 hyperlipidemia-continue statin.  Kirk Ruths, MD

## 2020-09-19 DIAGNOSIS — G4733 Obstructive sleep apnea (adult) (pediatric): Secondary | ICD-10-CM | POA: Diagnosis not present

## 2020-09-20 ENCOUNTER — Ambulatory Visit: Payer: BC Managed Care – PPO | Admitting: Cardiology

## 2020-11-03 ENCOUNTER — Ambulatory Visit: Payer: BC Managed Care – PPO | Admitting: Cardiology

## 2020-11-23 DIAGNOSIS — H00025 Hordeolum internum left lower eyelid: Secondary | ICD-10-CM | POA: Diagnosis not present

## 2020-12-04 DIAGNOSIS — H2513 Age-related nuclear cataract, bilateral: Secondary | ICD-10-CM | POA: Diagnosis not present

## 2020-12-04 DIAGNOSIS — E119 Type 2 diabetes mellitus without complications: Secondary | ICD-10-CM | POA: Diagnosis not present

## 2020-12-04 DIAGNOSIS — H00025 Hordeolum internum left lower eyelid: Secondary | ICD-10-CM | POA: Diagnosis not present

## 2020-12-04 DIAGNOSIS — H524 Presbyopia: Secondary | ICD-10-CM | POA: Diagnosis not present

## 2020-12-04 DIAGNOSIS — H40013 Open angle with borderline findings, low risk, bilateral: Secondary | ICD-10-CM | POA: Diagnosis not present

## 2020-12-11 ENCOUNTER — Ambulatory Visit: Payer: BC Managed Care – PPO | Admitting: Cardiology

## 2020-12-18 ENCOUNTER — Telehealth (INDEPENDENT_AMBULATORY_CARE_PROVIDER_SITE_OTHER): Payer: BC Managed Care – PPO | Admitting: Cardiology

## 2020-12-18 ENCOUNTER — Encounter: Payer: Self-pay | Admitting: Cardiology

## 2020-12-18 VITALS — BP 132/86 | HR 72 | Ht 64.0 in | Wt 271.6 lb

## 2020-12-18 DIAGNOSIS — I10 Essential (primary) hypertension: Secondary | ICD-10-CM | POA: Diagnosis not present

## 2020-12-18 DIAGNOSIS — Z7901 Long term (current) use of anticoagulants: Secondary | ICD-10-CM

## 2020-12-18 DIAGNOSIS — Z86711 Personal history of pulmonary embolism: Secondary | ICD-10-CM | POA: Diagnosis not present

## 2020-12-18 DIAGNOSIS — E119 Type 2 diabetes mellitus without complications: Secondary | ICD-10-CM | POA: Diagnosis not present

## 2020-12-18 DIAGNOSIS — I480 Paroxysmal atrial fibrillation: Secondary | ICD-10-CM

## 2020-12-18 DIAGNOSIS — G4733 Obstructive sleep apnea (adult) (pediatric): Secondary | ICD-10-CM

## 2020-12-18 DIAGNOSIS — Z9989 Dependence on other enabling machines and devices: Secondary | ICD-10-CM

## 2020-12-18 NOTE — Progress Notes (Signed)
Virtual Visit via Telephone Note   This visit type was conducted due to national recommendations for restrictions regarding the COVID-19 Pandemic (e.g. social distancing) in an effort to limit this patient's exposure and mitigate transmission in our community.  Due to her co-morbid illnesses, this patient is at least at moderate risk for complications without adequate follow up.  This format is felt to be most appropriate for this patient at this time.  The patient did not have access to video technology/had technical difficulties with video requiring transitioning to audio format only (telephone).  All issues noted in this document were discussed and addressed.  No physical exam could be performed with this format.  Please refer to the patient's chart for her  consent to telehealth for [facility].    Date:  12/18/2020   ID:  REMMI ARMENTEROS, DOB July 16, 1961, MRN [id number] The patient was identified using 2 identifiers.  Patient Location: Home Provider Location: Office/Clinic  PCP:  Prince Solian, MD  Cardiologist:  Kirk Ruths, MD  Electrophysiologist:  None   Evaluation Performed:  Follow-Up Visit  Chief Complaint:  none  History of Present Illness:    KEILAH LEMIRE is a pleasant 60 y.o. female with a hx of remote atrial fibrillation in 2010.  She had a low risk Myoview in 2015.  Other medical issues include essential hypertension, obesity, non-insulin-dependent diabetes, and dyslipidemia.  Dr. Stanford Breed had discussed anticoagulation with the patient in the past but she declined and took an aspirin daily.   The patient presented to the hospital February 2021 with shortness of breath and tachycardia.  She ruled in for bilateral pulmonary embolism and also had a right lower extremity DVT.  The patient says that she had been working from home and had been sitting for several hours a day at the computer.  She is also morbidly obese.  She also thinks she has a family history of some  type of clotting disorder.  She was placed on Eliquis and discharged and instructed to follow-up with [place].  I spoke with the patient in a virtual visit in March 2021.  She was doing well on the Eliquis, she had stopped her aspirin once this was started.  I encouraged her to speak with her family doctor about the recommendation for duration of the Eliquis therapy.  The patient was contacted today for routine virtual follow-up.  She has not actually seen her primary care provider yet about this, she does have a physical in February with him.  Her work situation has not really changed, she remains sedentary on the computer many hours a day at a time.  She has been taking the Eliquis.  She is not had any unusual chest pain, shortness of breath, or palpitations.  The patient does not have symptoms concerning for COVID-19 infection (fever, chills, cough, or new shortness of breath).    Past Medical History:  Diagnosis Date  . Acid reflux   . Atrial fibrillation ([place])   . Chest pain   . Diabetes mellitus   . Elevated cholesterol   . Fibroid   . Hydrosalpinx    Right  . Hypertension   . Migraine   . Ovarian cyst    Dermoid-Right   Serous cystadenoma-Left  . Sleep apnea with use of continuous positive airway pressure (CPAP)     02-19-13 , AHI 59.8 , titration to 10 cm water.    Past Surgical History:  Procedure Laterality Date  . ABDOMINAL SURGERY  Expl. Laparotomy  . GANGLION CYST EXCISION Right [phone]   wrist  . KENALOG INJECTION    . KNEE ARTHROSCOPY Left 08/20/12   torn meniscus  . KNEE SURGERY Right 2004-2005  . knee surgery, arthroscopic      sept 2013 , left knee   . MYOMECTOMY     Multiple  . MYOMECTOMY  [id number]  . OOPHORECTOMY     LSO  . SALPINGECTOMY     Right  . TONSILLECTOMY  1965  . VAGINAL HYSTERECTOMY  1992   partial     Current Meds  Medication Sig  . amLODipine (NORVASC) 10 MG tablet Take 10 mg by mouth every evening.  Marland Kitchen apixaban (ELIQUIS) 5 MG TABS  tablet Take 5 mg by mouth 2 (two) times daily.  . cholecalciferol (VITAMIN D3) 25 MCG (1000 UNIT) tablet Take 1,000 Units by mouth daily.  . colesevelam (WELCHOL) 625 MG tablet Take 625 mg by mouth 2 (two) times daily with a meal.  . Empagliflozin-metFORMIN HCl 12.03-999 MG TABS Take 1 tablet by mouth 2 (two) times daily.  . Eyelid Cleansers (OCUSOFT EYELID CLEANSING) PADS Place 1 application into both eyes every morning.  . fexofenadine (ALLEGRA) 180 MG tablet Take 180 mg by mouth every morning.  . insulin degludec (TRESIBA FLEXTOUCH) 100 UNIT/ML SOPN FlexTouch Pen Inject 60 Units into the skin every morning.  . metoprolol (LOPRESSOR) 50 MG tablet Take 50 mg by mouth 2 (two) times daily.  . Multiple Vitamin (MULTIVITAMIN) tablet Take 1 tablet by mouth daily.  . Multiple Vitamins-Minerals (AIRBORNE) CHEW Chew 1 tablet by mouth daily.  . pantoprazole (PROTONIX) 40 MG tablet Take 40 mg by mouth daily.  . pravastatin (PRAVACHOL) 40 MG tablet Take 40 mg by mouth every morning.   Marland Kitchen telmisartan (MICARDIS) 80 MG tablet Take 80 mg by mouth daily.  Marland Kitchen Ubiquinol 100 MG CAPS Take 100 mg by mouth every morning.     Allergies:   Codeine, Hydrocodone-acetaminophen, Propoxyphene n-acetaminophen, and Morphine   Social History   Tobacco Use  . Smoking status: Former Smoker    Quit date: 11/25/1995    Years since quitting: 25.0  . Smokeless tobacco: Never Used  Substance Use Topics  . Alcohol use: No  . Drug use: No     Family Hx: The patient's family history includes Breast cancer in her mother; Clotting disorder in her brother; Coronary artery disease in her brother; Crohn's disease in her brother; Heart disease in her brother; Hypertension in her brother.  ROS:   Please see the history of present illness.    All other systems reviewed and are negative.   Prior CV studies:   The following studies were reviewed today:  Echo 12/28/2019- IMPRESSIONS    1. Left ventricular ejection fraction,  by visual estimation, is 60 to  65%. The left ventricle has normal function. Left ventricular septal wall  thickness was mildly increased. Mildly increased left ventricular  posterior wall thickness.  2. The left ventricle has no regional wall motion abnormalities.  3. Left ventricular diastolic parameters are consistent with Grade I  diastolic dysfunction (impaired relaxation).  4. Global right ventricle has normal systolic function.The right  ventricular size is normal. No increase in right ventricular wall  thickness.  5. Left atrial size was normal.  6. Right atrial size was normal.  7. The mitral valve is normal in structure. No evidence of mitral valve  regurgitation. No evidence of mitral stenosis.  8. The tricuspid valve is normal in  structure. Tricuspid valve  regurgitation is not demonstrated.  9. The aortic valve is normal in structure. Aortic valve regurgitation is  not visualized. No evidence of aortic valve sclerosis or stenosis.  10. The pulmonic valve was normal in structure. Pulmonic valve  regurgitation is not visualized.  11. The inferior vena cava is dilated in size with >50% respiratory  variability, suggesting right atrial pressure of 8 mmHg.  12. TR signal is inadequate for assessing pulmonary artery systolic  pressure.   Labs/Other Tests and Data Reviewed:    EKG:  An ECG dated 01/03/2020 was personally reviewed today and demonstrated:  NSR. HR 100  Recent Labs: 12/27/2019: B Natriuretic Peptide 43.0 12/28/2019: TSH 0.361 12/29/2019: BUN 9; Creatinine, Ser 0.62; Hemoglobin 11.2; Platelets 311; Potassium 4.3; Sodium 141   Recent Lipid Panel No results found for: CHOL, TRIG, HDL, CHOLHDL, LDLCALC, LDLDIRECT  Wt Readings from Last 3 Encounters:  12/18/20 271 lb 9.6 oz (123.2 kg)  02/03/20 270 lb (122.5 kg)  12/28/19 283 lb 11.7 oz (128.7 kg)     Risk Assessment/Calculations:    CHA2DS2-VASc Score =   3 The patient's score is based upon:agem sex,  DM      Objective:    Vital Signs:  BP 132/86   Pulse 72   Ht 5\' 4"  (1.626 m)   Wt 271 lb 9.6 oz (123.2 kg)   BMI 46.62 kg/m    VITAL SIGNS:  reviewed  ASSESSMENT & PLAN:    Bilateral PE/RLE DVT- I suggested there patient contact her PCP about hypercoagulable work up- this may affect recommendations for duration of her AC therapy.   PAF- Remote without recurrence as far as we know. Before her PE she was a CHADS VASC=3 and declined AC.   Anticoagulated- She is on Eliquis for PE, hospital chart suggests possible 6 months of therapy but this may need to be continued indefinitely.   HTN- Controlled  NIDDM- Per PCP  HLD- On statin Rx  Morbid obesity- BMI 46  Plan: it sounds like long term anticoagulation is indicated.  She will review this further with her PCP.  F/U Dr Stanford Breed in a year.         COVID-19 Education: The signs and symptoms of COVID-19 were discussed with the patient and how to seek care for testing (follow up with PCP or arrange E-visit).  The importance of social distancing was discussed today.  Time:   Today, I have spent 15 minutes with the patient with telehealth technology discussing the above problems.     Medication Adjustments/Labs and Tests Ordered: Current medicines are reviewed at length with the patient today.  Concerns regarding medicines are outlined above.   Tests Ordered: No orders of the defined types were placed in this encounter.   Medication Changes: No orders of the defined types were placed in this encounter.   Follow Up:  In Person Dr Stanford Breed in one year  Signed, Kerin Ransom, Hershal Coria  12/18/2020 11:24 AM    [place]

## 2020-12-18 NOTE — Patient Instructions (Signed)
Medication Instructions:  Continue current medications  *If you need a refill on your cardiac medications before your next appointment, please call your pharmacy*   Lab Work: None Ordered   Testing/Procedures: None Ordered   Follow-Up: At [facility], you and your health needs are our priority.  As part of our continuing mission to provide you with exceptional heart care, we have created designated Provider Care Teams.  These Care Teams include your primary Cardiologist (physician) and Advanced Practice Providers (APPs -  Physician Assistants and Nurse Practitioners) who all work together to provide you with the care you need, when you need it.  We recommend signing up for the patient portal called "MyChart".  Sign up information is provided on this After Visit Summary.  MyChart is used to connect with patients for Virtual Visits (Telemedicine).  Patients are able to view lab/test results, encounter notes, upcoming appointments, etc.  Non-urgent messages can be sent to your provider as well.   To learn more about what you can do with MyChart, go to [email or web address].    Your next appointment:   1 year(s)  The format for your next appointment:   In Person  Provider:   You may see Brian Crenshaw, MD or one of the following Advanced Practice Providers on your designated Care Team:    Luke Kilroy, PA-C  Callie Goodrich, PA-C  Jesse Cleaver, FNP      

## 2020-12-29 DIAGNOSIS — E785 Hyperlipidemia, unspecified: Secondary | ICD-10-CM | POA: Diagnosis not present

## 2020-12-29 DIAGNOSIS — M859 Disorder of bone density and structure, unspecified: Secondary | ICD-10-CM | POA: Diagnosis not present

## 2020-12-29 DIAGNOSIS — E1165 Type 2 diabetes mellitus with hyperglycemia: Secondary | ICD-10-CM | POA: Diagnosis not present

## 2021-01-05 DIAGNOSIS — I2699 Other pulmonary embolism without acute cor pulmonale: Secondary | ICD-10-CM | POA: Diagnosis not present

## 2021-01-05 DIAGNOSIS — Z0000 Encounter for general adult medical examination without abnormal findings: Secondary | ICD-10-CM | POA: Diagnosis not present

## 2021-01-09 ENCOUNTER — Other Ambulatory Visit: Payer: Self-pay | Admitting: Internal Medicine

## 2021-01-09 DIAGNOSIS — Z1231 Encounter for screening mammogram for malignant neoplasm of breast: Secondary | ICD-10-CM

## 2021-01-15 DIAGNOSIS — G4733 Obstructive sleep apnea (adult) (pediatric): Secondary | ICD-10-CM | POA: Diagnosis not present

## 2021-03-01 ENCOUNTER — Ambulatory Visit: Payer: BC Managed Care – PPO

## 2021-03-30 DIAGNOSIS — J324 Chronic pansinusitis: Secondary | ICD-10-CM | POA: Diagnosis not present

## 2021-04-16 ENCOUNTER — Other Ambulatory Visit: Payer: Self-pay

## 2021-04-16 ENCOUNTER — Ambulatory Visit
Admission: RE | Admit: 2021-04-16 | Discharge: 2021-04-16 | Disposition: A | Discharge status: Home / Self Care | Payer: BC Managed Care – PPO | Source: Ambulatory Visit | Attending: Internal Medicine | Admitting: Internal Medicine

## 2021-04-16 DIAGNOSIS — Z1231 Encounter for screening mammogram for malignant neoplasm of breast: Secondary | ICD-10-CM

## 2021-05-14 DIAGNOSIS — M25562 Pain in left knee: Secondary | ICD-10-CM | POA: Diagnosis not present

## 2021-05-14 DIAGNOSIS — M25561 Pain in right knee: Secondary | ICD-10-CM | POA: Diagnosis not present

## 2021-05-30 DIAGNOSIS — Z20828 Contact with and (suspected) exposure to other viral communicable diseases: Secondary | ICD-10-CM | POA: Diagnosis not present

## 2021-05-30 DIAGNOSIS — R051 Acute cough: Secondary | ICD-10-CM | POA: Diagnosis not present

## 2021-05-30 DIAGNOSIS — R509 Fever, unspecified: Secondary | ICD-10-CM | POA: Diagnosis not present

## 2021-05-30 DIAGNOSIS — R0981 Nasal congestion: Secondary | ICD-10-CM | POA: Diagnosis not present

## 2021-05-30 DIAGNOSIS — J0100 Acute maxillary sinusitis, unspecified: Secondary | ICD-10-CM | POA: Diagnosis not present

## 2021-06-08 DIAGNOSIS — R059 Cough, unspecified: Secondary | ICD-10-CM | POA: Diagnosis not present

## 2021-06-08 DIAGNOSIS — E119 Type 2 diabetes mellitus without complications: Secondary | ICD-10-CM | POA: Diagnosis not present

## 2021-06-25 DIAGNOSIS — M25512 Pain in left shoulder: Secondary | ICD-10-CM | POA: Diagnosis not present

## 2021-06-26 DIAGNOSIS — E669 Obesity, unspecified: Secondary | ICD-10-CM | POA: Diagnosis not present

## 2021-06-26 DIAGNOSIS — M545 Low back pain, unspecified: Secondary | ICD-10-CM | POA: Diagnosis not present

## 2021-06-26 DIAGNOSIS — Z6841 Body Mass Index (BMI) 40.0 and over, adult: Secondary | ICD-10-CM | POA: Diagnosis not present

## 2021-07-04 DIAGNOSIS — E1165 Type 2 diabetes mellitus with hyperglycemia: Secondary | ICD-10-CM | POA: Diagnosis not present

## 2021-07-04 DIAGNOSIS — I10 Essential (primary) hypertension: Secondary | ICD-10-CM | POA: Diagnosis not present

## 2021-07-12 DIAGNOSIS — M25512 Pain in left shoulder: Secondary | ICD-10-CM | POA: Diagnosis not present

## 2021-07-20 DIAGNOSIS — M170 Bilateral primary osteoarthritis of knee: Secondary | ICD-10-CM | POA: Diagnosis not present

## 2021-07-25 DIAGNOSIS — M7542 Impingement syndrome of left shoulder: Secondary | ICD-10-CM | POA: Diagnosis not present

## 2021-07-25 DIAGNOSIS — Z6841 Body Mass Index (BMI) 40.0 and over, adult: Secondary | ICD-10-CM | POA: Diagnosis not present

## 2021-08-17 DIAGNOSIS — D6869 Other thrombophilia: Secondary | ICD-10-CM | POA: Diagnosis not present

## 2021-08-17 DIAGNOSIS — M5136 Other intervertebral disc degeneration, lumbar region: Secondary | ICD-10-CM | POA: Diagnosis not present

## 2021-08-17 DIAGNOSIS — Z86711 Personal history of pulmonary embolism: Secondary | ICD-10-CM | POA: Diagnosis not present

## 2021-10-09 DIAGNOSIS — G4733 Obstructive sleep apnea (adult) (pediatric): Secondary | ICD-10-CM | POA: Diagnosis not present

## 2021-11-08 DIAGNOSIS — G4733 Obstructive sleep apnea (adult) (pediatric): Secondary | ICD-10-CM | POA: Diagnosis not present

## 2021-11-14 DIAGNOSIS — R0981 Nasal congestion: Secondary | ICD-10-CM | POA: Diagnosis not present

## 2021-11-14 DIAGNOSIS — R519 Headache, unspecified: Secondary | ICD-10-CM | POA: Diagnosis not present

## 2021-11-14 DIAGNOSIS — Z20828 Contact with and (suspected) exposure to other viral communicable diseases: Secondary | ICD-10-CM | POA: Diagnosis not present

## 2021-11-14 DIAGNOSIS — R509 Fever, unspecified: Secondary | ICD-10-CM | POA: Diagnosis not present

## 2021-11-14 DIAGNOSIS — J0100 Acute maxillary sinusitis, unspecified: Secondary | ICD-10-CM | POA: Diagnosis not present

## 2021-12-09 DIAGNOSIS — G4733 Obstructive sleep apnea (adult) (pediatric): Secondary | ICD-10-CM | POA: Diagnosis not present

## 2021-12-25 DIAGNOSIS — M5416 Radiculopathy, lumbar region: Secondary | ICD-10-CM | POA: Diagnosis not present

## 2021-12-27 DIAGNOSIS — M859 Disorder of bone density and structure, unspecified: Secondary | ICD-10-CM | POA: Diagnosis not present

## 2021-12-27 DIAGNOSIS — E1165 Type 2 diabetes mellitus with hyperglycemia: Secondary | ICD-10-CM | POA: Diagnosis not present

## 2022-01-03 DIAGNOSIS — I10 Essential (primary) hypertension: Secondary | ICD-10-CM | POA: Diagnosis not present

## 2022-01-03 DIAGNOSIS — R82998 Other abnormal findings in urine: Secondary | ICD-10-CM | POA: Diagnosis not present

## 2022-01-03 DIAGNOSIS — Z0000 Encounter for general adult medical examination without abnormal findings: Secondary | ICD-10-CM | POA: Diagnosis not present

## 2022-01-23 NOTE — Progress Notes (Unsigned)
HPI:FU chest pain. Patient seen previously by Dr. Lovena Le for isolated episode of paroxysmal atrial fibrillation in Oct 2010. Nuclear study Feb 2015 showed EF 71 and normal perfusion.  Echocardiogram February 2021 showed normal LV function, mild left ventricular hypertrophy, grade 1 diastolic dysfunction.  Venous Dopplers February 2021 showed DVT on the right.  CTA February 2021 showed bilateral pulmonary emboli.  Since she was last seen,  Current Outpatient Medications  Medication Sig Dispense Refill   amLODipine (NORVASC) 10 MG tablet Take 10 mg by mouth every evening.     apixaban (ELIQUIS) 5 MG TABS tablet Take 5 mg by mouth 2 (two) times daily.     cholecalciferol (VITAMIN D3) 25 MCG (1000 UNIT) tablet Take 1,000 Units by mouth daily.     colesevelam (WELCHOL) 625 MG tablet Take 625 mg by mouth 2 (two) times daily with a meal.     Empagliflozin-metFORMIN HCl 12.03-999 MG TABS Take 1 tablet by mouth 2 (two) times daily.     Eyelid Cleansers (OCUSOFT EYELID CLEANSING) PADS Place 1 application into both eyes every morning.     fexofenadine (ALLEGRA) 180 MG tablet Take 180 mg by mouth every morning.     insulin degludec (TRESIBA FLEXTOUCH) 100 UNIT/ML SOPN FlexTouch Pen Inject 60 Units into the skin every morning.     metoprolol (LOPRESSOR) 50 MG tablet Take 50 mg by mouth 2 (two) times daily.     Multiple Vitamin (MULTIVITAMIN) tablet Take 1 tablet by mouth daily.     Multiple Vitamins-Minerals (AIRBORNE) CHEW Chew 1 tablet by mouth daily.     pantoprazole (PROTONIX) 40 MG tablet Take 40 mg by mouth daily.     pravastatin (PRAVACHOL) 40 MG tablet Take 40 mg by mouth every morning.      telmisartan (MICARDIS) 80 MG tablet Take 80 mg by mouth daily.     Ubiquinol 100 MG CAPS Take 100 mg by mouth every morning.     No current facility-administered medications for this visit.     Past Medical History:  Diagnosis Date   Acid reflux    Atrial fibrillation (H[facility])    Chest pain     Diabetes mellitus    Elevated cholesterol    Fibroid    Hydrosalpinx    Right   Hypertension    Migraine    Ovarian cyst    Dermoid-Right   Serous cystadenoma-Left   Sleep apnea with use of continuous positive airway pressure (CPAP)     02-19-13 , AHI 59.8 , titration to 10 cm water.     Past Surgical History:  Procedure Laterality Date   ABDOMINAL SURGERY     Expl. Laparotomy   GANGLION CYST EXCISION Right 1986-1987   wrist   KENALOG INJECTION     KNEE ARTHROSCOPY Left 08/20/12   torn meniscus   KNEE SURGERY Right 2004-2005   knee surgery, arthroscopic      sept 2013 , left knee    MYOMECTOMY     Multiple   MYOMECTOMY  1987-1988   OOPHORECTOMY     LSO   SALPINGECTOMY     Right   TONSILLECTOMY  1965   VAGINAL HYSTERECTOMY  1992   partial    Social History   Socioeconomic History   Marital status: Married    Spouse name: Not on file   Number of children: 3   Years of education: 12   Highest education level: Not on file  Occupational History  Employer: [employer]  Tobacco Use   Smoking status: Former    Types: Cigarettes    Quit date: 11/25/1995    Years since quitting: 26.1   Smokeless tobacco: Never  Substance and Sexual Activity   Alcohol use: No   Drug use: No   Sexual activity: Never    Birth control/protection: Surgical  Other Topics Concern   Not on file  Social History Narrative   Right handed.  Caffeine 2 cups day.  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  Married, 2 bio kids, 1 step   Social Determinants of Radio broadcast assistant Strain: Not on file  Food Insecurity: Not on file  Transportation Needs: Not on file  Physical Activity: Not on file  Stress: Not on file  Social Connections: Not on file  Intimate Partner Violence: Not on file    Family History  Problem Relation Age of Onset   Breast cancer Mother        Age 3   Heart disease Brother    Coronary artery disease Brother        stent placement   Hypertension  Brother    Crohn's disease Brother    Clotting disorder Brother     ROS: no fevers or chills, productive cough, hemoptysis, dysphasia, odynophagia, melena, hematochezia, dysuria, hematuria, rash, seizure activity, orthopnea, PND, pedal edema, claudication. Remaining systems are negative.  Physical Exam: Well-developed well-nourished in no acute distress.  Skin is warm and dry.  HEENT is normal.  Neck is supple.  Chest is clear to auscultation with normal expansion.  Cardiovascular exam is regular rate and rhythm.  Abdominal exam nontender or distended. No masses palpated. Extremities show no edema. neuro grossly intact  ECG- personally reviewed  A/P  1 atrial fibrillation-patient had a documented episode of atrial fibrillation in 2010 but no recurrences.  Continue beta-blocker.  She previously declined anticoagulation.  2 hypertension-patient's blood pressure is controlled.  Continue present medical regimen.  3 hyperlipidemia-continue statin.  4 morbid obesity-we discussed importance of weight loss.  5 previous pulmonary embolus-  Kirk Ruths, MD

## 2022-01-24 DIAGNOSIS — M1711 Unilateral primary osteoarthritis, right knee: Secondary | ICD-10-CM | POA: Diagnosis not present

## 2022-01-31 DIAGNOSIS — M1711 Unilateral primary osteoarthritis, right knee: Secondary | ICD-10-CM | POA: Diagnosis not present

## 2022-02-05 ENCOUNTER — Ambulatory Visit: Payer: BC Managed Care – PPO | Admitting: Cardiology

## 2022-02-05 ENCOUNTER — Other Ambulatory Visit: Payer: Self-pay

## 2022-02-05 ENCOUNTER — Encounter: Payer: Self-pay | Admitting: Cardiology

## 2022-02-05 VITALS — BP 155/71 | HR 62 | Ht 64.0 in | Wt 281.2 lb

## 2022-02-05 DIAGNOSIS — I10 Essential (primary) hypertension: Secondary | ICD-10-CM

## 2022-02-05 DIAGNOSIS — Z86711 Personal history of pulmonary embolism: Secondary | ICD-10-CM

## 2022-02-05 DIAGNOSIS — I480 Paroxysmal atrial fibrillation: Secondary | ICD-10-CM

## 2022-02-05 NOTE — Patient Instructions (Signed)

## 2022-02-07 DIAGNOSIS — M13861 Other specified arthritis, right knee: Secondary | ICD-10-CM | POA: Diagnosis not present

## 2022-02-13 DIAGNOSIS — Z01818 Encounter for other preprocedural examination: Secondary | ICD-10-CM | POA: Diagnosis not present

## 2022-02-21 DIAGNOSIS — G4733 Obstructive sleep apnea (adult) (pediatric): Secondary | ICD-10-CM | POA: Diagnosis not present

## 2022-03-05 DIAGNOSIS — H0012 Chalazion right lower eyelid: Secondary | ICD-10-CM | POA: Diagnosis not present

## 2022-03-11 ENCOUNTER — Other Ambulatory Visit: Payer: Self-pay | Admitting: Internal Medicine

## 2022-03-11 DIAGNOSIS — Z1231 Encounter for screening mammogram for malignant neoplasm of breast: Secondary | ICD-10-CM

## 2022-03-13 DIAGNOSIS — R300 Dysuria: Secondary | ICD-10-CM | POA: Diagnosis not present

## 2022-03-22 DIAGNOSIS — M1711 Unilateral primary osteoarthritis, right knee: Secondary | ICD-10-CM | POA: Diagnosis not present

## 2022-03-24 DIAGNOSIS — G4733 Obstructive sleep apnea (adult) (pediatric): Secondary | ICD-10-CM | POA: Diagnosis not present

## 2022-04-01 ENCOUNTER — Encounter: Payer: Self-pay | Admitting: Neurology

## 2022-04-01 ENCOUNTER — Ambulatory Visit: Payer: BC Managed Care – PPO | Admitting: Neurology

## 2022-04-01 VITALS — BP 153/80 | HR 60 | Ht 64.0 in | Wt 280.0 lb

## 2022-04-01 DIAGNOSIS — I480 Paroxysmal atrial fibrillation: Secondary | ICD-10-CM

## 2022-04-01 DIAGNOSIS — E119 Type 2 diabetes mellitus without complications: Secondary | ICD-10-CM

## 2022-04-01 DIAGNOSIS — K2100 Gastro-esophageal reflux disease with esophagitis, without bleeding: Secondary | ICD-10-CM

## 2022-04-01 DIAGNOSIS — G4730 Sleep apnea, unspecified: Secondary | ICD-10-CM

## 2022-04-01 DIAGNOSIS — Z6841 Body Mass Index (BMI) 40.0 and over, adult: Secondary | ICD-10-CM

## 2022-04-01 DIAGNOSIS — R0601 Orthopnea: Secondary | ICD-10-CM

## 2022-04-01 NOTE — Progress Notes (Signed)
? ? ?SLEEP MEDICINE CLINIC ?  ? ?Provider:  Larey Seat, MD  ?Primary Care Physician:  Prince Solian, MD ?[address] ?[place] [address]  ? ?  ?Referring Provider: Prince Solian, Md ?[address] ?[place],  [place] [address]  ?  ?  ?    ?Chief Complaint according to patient   ?Patient presents with:  ?  ? New Patient (Initial Visit)  ?     ?  ?  ?HISTORY OF PRESENT ILLNESS:  ?Mary Sharp is a 61 y.o. female patient who had been last seen here in 2016 (!) and received her CPAP machine in June 2014- seen here upon a referral on 04/01/2022 from Dr. Dagmar Hait, MD, PhD.  for a new consultation and new CPAP. Mary Sharp  ?Chief concern according to patient :  " my machine is displaying a message that it needs to be replaced." ? ? ?[name] has used the same machine now for 9 years set up was Mar 29, 2013.Mary Sharp  She is meanwhile 61 years old she is still 100% compliant user of her CPAP by days and hours with an average use at time of 7 hours 30 minutes at night.  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  Her residual AHI is green at 0.515/h and she has very little air leakage 95th percentile air leak was 2.1 L a minute. ? ? ?  ? Mary Sharp  has a past medical history of Acid reflux, Atrial fibrillation ([place]), Chest pain, GERD, Diabetes mellitus, Elevated cholesterol, Fibroid, Hydrosalpinx, Hypertension, Migraine, Ovarian cyst, and OSA on CPAP ( 2014) Sleep apnea with use of continuous positive airway pressure (CPAP). In 1/ 2015 admitted for PE- and unproved deep vein origin, doppler were normal.  ED report in epic.  ? ?  ?The patient had the first sleep study in the year 2014 at [facility]   with a result of an AHI ( Apnea Hypopnea index)  of 59.8/h . ?Sleep apnea with use of continuous positive airway pressure (CPAP)     ?     02-19-13 , AHI 59.8 , titration to 10 cm water.   ? ?SPLIT night titration was to 10 cm water pressure , using an nasal  mask.   ?Sleep relevant medical history: Nocturia every 2 hours 3-4 times each night - while on CPAP,,   ? Family medical /sleep history: no other family member on CPAP with OSA, insomnia, sleep walkers.  ?  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  ?The patient currently works daytime. Pets are not present. ?Tobacco use- none since 1996   ?ETOH use; none ,  ?Caffeine intake in form of Coffee( 2 cups in AM ) . ? ?  ?  ?Sleep habits are as follows: The patient's dinner time is between 6-7 PM. The patient goes to bed at 9 PM and continues to sleep for 2 hour intervals, 3 times  wakes for  bathroom breaks, the first time at 12 AM.   ?The preferred sleep position is supine, with the support of 1 pillow , on a recliner  ?Dreams are reportedly rare.  ?4  AM is the usual rise time. The patient wakes up spontaneously. Work starts at 7 AM.  ?  She reports not feeling refreshed or restored in AM, with symptoms such as dry mouth ( rarely ) , but no morning headaches, and residual fatigue.  ?Naps are taken infrequently.  ?  ?Review of Systems: ?Out of a complete 14 system review, the patient complains of only the following symptoms, and all other reviewed systems are negative.:  ?, fragmented sleep, Nocturia.  ?  ? ? ?ON CPAP :  ?How likely are you to doze in the following situations: ?0 = not likely, 1 = slight chance, 2 = moderate chance, 3 = high chance ?  ?Sitting and Reading? ?Watching Television? ?Sitting inactive in a public place (theater or meeting)? ?As a passenger in a car for an hour without a break? ?Lying down in the afternoon when circumstances permit? ?Sitting and talking to someone? ?Sitting quietly after lunch without alcohol? ?In a car, while stopped for a few minutes in traffic? ?  ?Total = 4/ 24 points  ? FSS endorsed at 13/ 63 points.  ? ?Social History   ? ?Socioeconomic History  ? Marital status: Married  ?  Spouse name: Mary Sharp  ? Number of children: 3  ? Years of education: 58  ? Highest education level: Not on file  ?Occupational History  ?  Employer: [employer]  ?Tobacco Use  ? Smoking status: Former  ?  Types: Cigarettes  ?  Quit date: 11/25/1995  ?  Years since quitting: 26.3  ? Smokeless tobacco: Never  ?Substance and Sexual Activity  ? Alcohol use: No  ? Drug use: No  ? Sexual activity: Never  ?  Birth control/protection: Surgical  ?Other Topics Concern  ? Not on file  ?Social History Narrative  ? Live as home w husband  ? Right handed.    ? Caffeine 2 cups day.  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    ? Married, 2 bio kids, 1 step  ? ?Social Determinants of Health  ? ?Financial Resource Strain: Not on file  ?Food Insecurity: Not on file  ?Transportation Needs: Not on file  ?Physical Activity: Not on file  ?Stress: Not on file  ?Social Connections: Not on file  ? ? ?Family History  ?Problem Relation Age of Onset  ? Breast cancer Mother   ?     Age 72  ? Heart disease Brother   ? Coronary artery disease Brother   ?     stent placement  ? Hypertension Brother   ? Crohn's disease Brother   ? Clotting disorder Brother   ? ? ?Past Medical History:  ?Diagnosis Date  ? Acid reflux   ? Atrial fibrillation ([place])   ? Chest pain   ? Diabetes mellitus   ? Elevated cholesterol   ? Fibroid   ? Hydrosalpinx   ? Right  ? Hypertension   ? Migraine   ? Ovarian cyst   ? Dermoid-Right   Serous cystadenoma-Left  ? Sleep apnea with use of continuous positive airway pressure (CPAP)   ?  02-19-13 , AHI 59.8 , titration to 10 cm water.   ? ? ?Past Surgical History:  ?Procedure Laterality Date  ? ABDOMINAL SURGERY    ? Expl. Laparotomy  ? GANGLION CYST EXCISION Right [phone]  ? wrist  ? KENALOG INJECTION    ? KNEE ARTHROSCOPY Left 08/20/12  ? torn meniscus  ? KNEE SURGERY Right 2004-2005  ? knee surgery, arthroscopic     ? sept 2013 , left knee   ? MYOMECTOMY    ?  Multiple  ?  MYOMECTOMY  [phone]  ? OOPHORECTOMY    ? LSO  ? SALPINGECTOMY    ? Right  ? TONSILLECTOMY  1965  ? VAGINAL HYSTERECTOMY  1992  ? partial  ?  ? ?Current Outpatient Medications on File Prior to Visit  ?Medication Sig Dispense Refill  ? amLODipine (NORVASC) 10 MG tablet Take 10 mg by mouth every evening.    ? apixaban (ELIQUIS) 5 MG TABS tablet Take 5 mg by mouth 2 (two) times daily.    ? cholecalciferol (VITAMIN D3) 25 MCG (1000 UNIT) tablet Take 2,000 Units by mouth daily.    ? colesevelam (WELCHOL) 625 MG tablet Take 625 mg by mouth 2 (two) times daily with a meal.    ? Empagliflozin-metFORMIN HCl 12.03-999 MG TABS Take 1 tablet by mouth 2 (two) times daily.    ? Eyelid Cleansers (OCUSOFT EYELID CLEANSING) PADS Place 1 application into both eyes every morning.    ? fexofenadine (ALLEGRA) 180 MG tablet Take 180 mg by mouth every morning.    ? insulin degludec (TRESIBA FLEXTOUCH) 100 UNIT/ML SOPN FlexTouch Pen Inject 75 Units into the skin every morning.    ? metoprolol (LOPRESSOR) 50 MG tablet Take 50 mg by mouth 2 (two) times daily.    ? Multiple Vitamin (MULTIVITAMIN) tablet Take 1 tablet by mouth daily.    ? Multiple Vitamins-Minerals (AIRBORNE) CHEW Chew 1 tablet by mouth daily.    ? pantoprazole (PROTONIX) 40 MG tablet Take 40 mg by mouth daily.    ? pravastatin (PRAVACHOL) 40 MG tablet Take 40 mg by mouth every morning.     ? telmisartan (MICARDIS) 80 MG tablet Take 80 mg by mouth daily.    ? Ubiquinol 100 MG CAPS Take 100 mg by mouth every morning.    ? ?No current facility-administered medications on file prior to visit.  ? ? ?Allergies  ?Allergen Reactions  ? Codeine Nausea And Vomiting  ? Doxycycline Diarrhea  ? Hydrocodone-Acetaminophen Nausea Only  ? Propoxyphene N-Acetaminophen Nausea Only  ? Morphine Nausea Only and Rash  ? ? ?Physical exam: ? ?There were no vitals filed for this visit. ?There is no height or weight on file to calculate BMI.  ? ?Wt Readings from Last 3 Encounters:  ?02/05/22 281 lb  3.2 oz (127.6 kg)  ?12/18/20 271 lb 9.6 oz (123.2 kg)  ?02/03/20 270 lb (122.5 kg)  ?  ? ?Ht Readings from Last 3 Encounters:  ?02/05/22 '5\' 4"'$  (1.626 m)  ?12/18/20 '5\' 4"'$  (1.626 m)  ?02/03/20 '5\' 4"'$  (1.626 m)  ?  ?  ?Gener

## 2022-04-01 NOTE — Patient Instructions (Signed)
Sleep Apnea ?Sleep apnea affects breathing during sleep. It causes breathing to stop for 10 seconds or more, or to become shallow. People with sleep apnea usually snore loudly. ?It can also increase the risk of: ?Heart attack. ?Stroke. ?Being very overweight (obese). ?Diabetes. ?Heart failure. ?Irregular heartbeat. ?High blood pressure. ?The goal of treatment is to help you breathe normally again. ?What are the causes? ? ?The most common cause of this condition is a collapsed or blocked airway. ?There are three kinds of sleep apnea: ?Obstructive sleep apnea. This is caused by a blocked or collapsed airway. ?Central sleep apnea. This happens when the brain does not send the right signals to the muscles that control breathing. ?Mixed sleep apnea. This is a combination of obstructive and central sleep apnea. ?What increases the risk? ?Being overweight. ?Smoking. ?Having a small airway. ?Being older. ?Being female. ?Drinking alcohol. ?Taking medicines to calm yourself (sedatives or tranquilizers). ?Having family members with the condition. ?Having a tongue or tonsils that are larger than normal. ?What are the signs or symptoms? ?Trouble staying asleep. ?Loud snoring. ?Headaches in the morning. ?Waking up gasping. ?Dry mouth or sore throat in the morning. ?Being sleepy or tired during the day. ?If you are sleepy or tired during the day, you may also: ?Not be able to focus your mind (concentrate). ?Forget things. ?Get angry a lot and have mood swings. ?Feel sad (depressed). ?Have changes in your personality. ?Have less interest in sex, if you are female. ?Be unable to have an erection, if you are female. ?How is this treated? ? ?Sleeping on your side. ?Using a medicine to get rid of mucus in your nose (decongestant). ?Avoiding the use of alcohol, medicines to help you relax, or certain pain medicines (narcotics). ?Losing weight, if needed. ?Changing your diet. ?Quitting smoking. ?Using a machine to open your airway while you  sleep, such as: ?An oral appliance. This is a mouthpiece that shifts your lower jaw forward. ?A CPAP device. This device blows air through a mask when you breathe out (exhale). ?An EPAP device. This has valves that you put in each nostril. ?A BIPAP device. This device blows air through a mask when you breathe in (inhale) and breathe out. ?Having surgery if other treatments do not work. ?Follow these instructions at home: ?Lifestyle ?Make changes that your doctor recommends. ?Eat a healthy diet. ?Lose weight if needed. ?Avoid alcohol, medicines to help you relax, and some pain medicines. ?Do not smoke or use any products that contain nicotine or tobacco. If you need help quitting, ask your doctor. ?General instructions ?Take over-the-counter and prescription medicines only as told by your doctor. ?If you were given a machine to use while you sleep, use it only as told by your doctor. ?If you are having surgery, make sure to tell your doctor you have sleep apnea. You may need to bring your device with you. ?Keep all follow-up visits. ?Contact a doctor if: ?The machine that you were given to use during sleep bothers you or does not seem to be working. ?You do not get better. ?You get worse. ?Get help right away if: ?Your chest hurts. ?You have trouble breathing in enough air. ?You have an uncomfortable feeling in your back, arms, or stomach. ?You have trouble talking. ?One side of your body feels weak. ?A part of your face is hanging down. ?These symptoms may be an emergency. Get help right away. Call your local emergency services (911 in the [place] ?Do not wait to see if the   symptoms will go away. ?Do not drive yourself to the hospital. ?Summary ?This condition affects breathing during sleep. ?The most common cause is a collapsed or blocked airway. ?The goal of treatment is to help you breathe normally while you sleep. ?This information is not intended to replace advice given to you by your health care provider. Make  sure you discuss any questions you have with your health care provider. ?Document Revised: 06/20/2021 Document Reviewed: 10/20/2020 ?Elsevier Patient Education ? [place]. ?Obesity-Hypoventilation Syndrome ?Obesity-hypoventilation syndrome (OHS) is a condition in which a person cannot efficiently move air in and out of the lungs (ventilate). This condition causes a buildup of carbon dioxidelevels in the blood and a drop in oxygen levels. ?OHS can increase the risk for: ?Cor pulmonale, or right-sided heart failure. ?Left-sided heart failure. ?Pulmonary hypertension, or high blood pressure in the arteries in the lungs. ?Too many red blood cells in the body. ?Disability or death. ?What are the causes? ?This condition may be caused by: ?Being obese with a BMI (body mass index) greater than or equal to 30 kg/m2. ?Having too much fat around the abdomen, chest, and neck. ?The brain being unable to properly manage the high carbon dioxide and low oxygen levels. ?Hormones made by fat cells. These hormones may interfere with breathing. ?Sleep apnea. This is when breathing stops, pauses, or is shallow during sleep. ?What are the signs or symptoms? ?Symptoms of this condition include: ?Feeling sleepy during the day. ?Headaches. These may be worse in the morning. ?Shortness of breath. ?Snoring, choking, gasping, or trouble breathing during sleep. ?Poor concentration or poor memory. ?Mood changes or feeling irritable. ?Depression. ?How is this diagnosed? ?This condition may be diagnosed by: ?BMI measurement. ?Blood tests to measure blood levels of serum bicarbonate, carbon dioxide, and oxygen. ?Pulse oximetry to measure the amount of oxygen in your blood. This uses a small device that is placed on your finger, earlobe, or toe. ?Polysomnogram, or sleep study, to check your breathing patterns and levels of oxygen and carbon dioxide while you sleep. ?You may also have other tests, including: ?A chest X-ray to rule out other  breathing problems. ?Lung tests, or pulmonary function tests, to rule out other breathing problems. ?Electrocardiogram (ECG) or echocardiogram to check for signs of heart failure. ?How is this treated? ?This condition may be treated with: ?A device such as a continuous positive airway pressure (CPAP) machine or a bi-level positive airway pressure (BIPAP) machine. These devices deliver pressure and sometimes oxygen to make breathing easier. A mask may be placed over your nose or mouth. ?Oxygen if your blood oxygen levels are low. ?A weight-loss program. ?Bariatric, or weight-loss, surgery. ?Tracheostomy. A tube is placed in the windpipe through the neck to help with breathing. ?Follow these instructions at home: ? ?Medicines ?Take over-the-counter and prescription medicines only as told by your health care provider. ?Ask your health care provider what medicines are safe for you. You may be told to avoid medicines such as sedatives and narcotics. These can affect breathing and make OHS worse. ?Sleeping habits ?If you are prescribed a CPAP or a BIPAP machine, make sure you understand how to use it. Use your CPAP or BIPAP machine only as told by your health care provider. ?Try to get at least 8 hours of sleep every night. ?Eating and drinking ? ?Eat foods that are high in fiber, such as beans, whole grains, and fresh fruits and vegetables. ?Limit foods that are high in fat and processed sugars, such as  fried or sweet foods. ?Drink enough fluid to keep your urine pale yellow. ?Do not drink alcohol if: ?Your health care provider tells you not to drink. ?You are pregnant, may be pregnant, or are planning to become pregnant. ?General instructions ?Follow a diet and exercise plan that helps you reach and keep a healthy weight as told by your health care provider. ?Exercise regularly as told by your health care provider. ?Do not use any products that contain nicotine or tobacco. These products include cigarettes, chewing  tobacco, and vaping devices, such as e-cigarettes. If you need help quitting, ask your health care provider. ?Keep all follow-up visits. This is important. ?Contact a health care provider if: ?You develop

## 2022-04-02 DIAGNOSIS — M5416 Radiculopathy, lumbar region: Secondary | ICD-10-CM | POA: Diagnosis not present

## 2022-04-23 ENCOUNTER — Ambulatory Visit
Admission: RE | Admit: 2022-04-23 | Discharge: 2022-04-23 | Disposition: A | Discharge status: Home / Self Care | Payer: BC Managed Care – PPO | Source: Ambulatory Visit | Attending: Internal Medicine | Admitting: Internal Medicine

## 2022-04-23 DIAGNOSIS — Z1231 Encounter for screening mammogram for malignant neoplasm of breast: Secondary | ICD-10-CM

## 2022-04-23 DIAGNOSIS — G4733 Obstructive sleep apnea (adult) (pediatric): Secondary | ICD-10-CM | POA: Diagnosis not present

## 2022-04-24 ENCOUNTER — Telehealth: Payer: Self-pay

## 2022-04-24 DIAGNOSIS — I700 Atherosclerosis of aorta: Secondary | ICD-10-CM

## 2022-04-24 NOTE — Telephone Encounter (Signed)
   Pre-operative Risk Assessment    Patient Name: Mary Sharp  DOB: 1961/08/07 MRN: [id number]      Request for Surgical Clearance    Procedure:   Colonoscopy  Date of Surgery:  Clearance 07/15/22                                 Surgeon:  Dr. Paulita Fujita Surgeon's Group or Practice Name:  [facility] Phone number:  [phone] Fax number:  [phone]   Type of Clearance Requested:   - Medical  - Pharmacy:  Hold Apixaban (Eliquis) x 2-3 dyas   Type of Anesthesia:   Propofol   Additional requests/questions:   None  Signed, Berdell Nevitt   04/24/2022, 4:44 PM

## 2022-04-24 NOTE — Telephone Encounter (Signed)
Clinical pharmacist to review Eliquis 

## 2022-04-25 ENCOUNTER — Other Ambulatory Visit: Payer: Self-pay | Admitting: Internal Medicine

## 2022-04-25 DIAGNOSIS — R928 Other abnormal and inconclusive findings on diagnostic imaging of breast: Secondary | ICD-10-CM

## 2022-04-25 DIAGNOSIS — I700 Atherosclerosis of aorta: Secondary | ICD-10-CM | POA: Insufficient documentation

## 2022-04-25 NOTE — Telephone Encounter (Signed)
Left message for the pt to call the office and ask to s/w the pre op team. See notes from pre op provider.

## 2022-04-25 NOTE — Telephone Encounter (Signed)
Patient with diagnosis of afib and bilateral PE and DVT Feb 2021 on Eliquis for anticoagulation. Was not on anticoagulation at the time of her VTE, has not had any recurrence.  Procedure: colonoscopy Date of procedure: 07/15/22  CHA2DS2-VASc Score = 4  This indicates a 4.8% annual risk of stroke. The patient's score is based upon: CHF History: 0 HTN History: 1 Diabetes History: 1 Stroke History: 0 Vascular Disease History: 1 Age Score: 0 Gender Score: 1   Aortic atherosclerosis noted on CT angio 12/2019, PMH updated.  Has not had CBC or BMET checked since Feb 2021. Needs both labs updated. We have not been refilling her Eliquis. As long as lab work is stable, she can hold Eliquis for 2 days prior to procedure.

## 2022-04-25 NOTE — Telephone Encounter (Signed)
Please verify if the patient still taking Eliquis, if she is, our clinical pharmacist need updated blood work in order to calculate how long to hold her Eliquis prior to upcoming GI procedure

## 2022-04-26 ENCOUNTER — Telehealth: Payer: Self-pay | Admitting: *Deleted

## 2022-04-26 NOTE — Telephone Encounter (Signed)
I s/w the pt and she has confirmed she is still taking Eliquis 5 mg BID. She states she discussed this back with Dr. Stanford Breed at her appt 01/2022 about remaining on Eliquis. Pt also tells me she had lab work with PCP 12/27/21 for her physical. I assured the pt that I will call over to PCP office to have labs faxed to our office. Pt is agreeable if she needs to come to our office for lab work she is happy to do so.    I assured the pt I will let her know if we still need to do labs in our office. Pt agreeable to tele pre op call 06/24/22 @ 9 am per pre op provider pt will need tele appt as she last saw MD 01/2022 and her procedure is not until 06/2022. Pt was very grateful for our call today. I will update the pre op provider with the new information.    I called PCP and left message for Caryl Pina, Dr. Dagmar Hait assistant to please fax over labs from 12/27/21 to fax # [phone]    Med rec and consent are done.     Patient Consent for Virtual Visit        Mary Sharp has provided verbal consent on 04/26/2022 for a virtual visit (video or telephone).   CONSENT FOR VIRTUAL VISIT FOR:  Mary Sharp  By participating in this virtual visit I agree to the following:  I hereby voluntarily request, consent and authorize [facility] and its employed or contracted physicians, physician assistants, nurse practitioners or other licensed health care professionals (the Practitioner), to provide me with telemedicine health care services (the "Services") as deemed necessary by the treating Practitioner. I acknowledge and consent to receive the Services by the Practitioner via telemedicine. I understand that the telemedicine visit will involve communicating with the Practitioner through live audiovisual communication technology and the disclosure of certain medical information by electronic transmission. I acknowledge that I have been given the opportunity to request an in-person assessment or other available alternative  prior to the telemedicine visit and am voluntarily participating in the telemedicine visit.  I understand that I have the right to withhold or withdraw my consent to the use of telemedicine in the course of my care at any time, without affecting my right to future care or treatment, and that the Practitioner or I may terminate the telemedicine visit at any time. I understand that I have the right to inspect all information obtained and/or recorded in the course of the telemedicine visit and may receive copies of available information for a reasonable fee.  I understand that some of the potential risks of receiving the Services via telemedicine include:  Delay or interruption in medical evaluation due to technological equipment failure or disruption; Information transmitted may not be sufficient (e.g. poor resolution of images) to allow for appropriate medical decision making by the Practitioner; and/or  In rare instances, security protocols could fail, causing a breach of personal health information.  Furthermore, I acknowledge that it is my responsibility to provide information about my medical history, conditions and care that is complete and accurate to the best of my ability. I acknowledge that Practitioner's advice, recommendations, and/or decision may be based on factors not within their control, such as incomplete or inaccurate data provided by me or distortions of diagnostic images or specimens that may result from electronic transmissions. I understand that the practice of medicine is not an exact science and that Practitioner  makes no warranties or guarantees regarding treatment outcomes. I acknowledge that a copy of this consent can be made available to me via my patient portal ([facility]), or I can request a printed copy by calling the office of [facility].    I understand that my insurance will be billed for this visit.   I have read or had this consent read to me. I understand  the contents of this consent, which adequately explains the benefits and risks of the Services being provided via telemedicine.  I have been provided ample opportunity to ask questions regarding this consent and the Services and have had my questions answered to my satisfaction. I give my informed consent for the services to be provided through the use of telemedicine in my medical care

## 2022-04-26 NOTE — Telephone Encounter (Signed)
ADDENDUM FOR PRE OP CLEARANCE: LABS HAVE BEEN RECEIVED BY PCP: I WILL UPDATE THE PRE OP PROVIDER.   WBC 9.19 RBC 5.6 HGB 12.2 HCT 39.8 PLT 340  CREATININE 0.8 eGFR NON-AFRICAN AMERICAN 72.7 SODIUM 135 K+ 4.5 CHLORIDE 101 CALCIUM 9.2 TOTAL PROTEIN 7.4 ALBUMIN 3.8 AST 19 ALT 20 ALK/PHOS 93 BUN 24

## 2022-04-26 NOTE — Telephone Encounter (Signed)
I s/w the pt and she has confirmed she is still taking Eliquis 5 mg BID. She states she discussed this back with Dr. Stanford Breed at her appt 01/2022 about remaining on Eliquis. Pt also tells me she had lab work with PCP 12/27/21 for her physical. I assured the pt that I will call over to PCP office to have labs faxed to our office. Pt is agreeable if she needs to come to our office for lab work she is happy to do so.   I assured the pt I will let her know if we still need to do labs in our office. Pt agreeable to tele pre op call 06/24/22 @ 9 am per pre op provider pt will need tele appt as she last saw MD 01/2022 and her procedure is not until 06/2022. Pt was very grateful for our call today. I will update the pre op provider with the new information.   I called PCP and left message for Caryl Pina, Dr. Dagmar Hait assistant to please fax over labs from 12/27/21 to fax # [phone]

## 2022-04-26 NOTE — Telephone Encounter (Signed)
Error

## 2022-04-26 NOTE — Telephone Encounter (Signed)
I discussed with Fuller Canada, our clinical pharmacist who reviewed the patient's lab work from 12/27/2021.  No change to the previous recommendation to hold Eliquis for 2 days prior to the procedure.

## 2022-05-01 ENCOUNTER — Ambulatory Visit
Admission: RE | Admit: 2022-05-01 | Discharge: 2022-05-01 | Disposition: A | Discharge status: Home / Self Care | Payer: BC Managed Care – PPO | Source: Ambulatory Visit | Attending: Internal Medicine | Admitting: Internal Medicine

## 2022-05-01 ENCOUNTER — Telehealth: Payer: Self-pay | Admitting: Neurology

## 2022-05-01 ENCOUNTER — Other Ambulatory Visit: Payer: Self-pay | Admitting: Internal Medicine

## 2022-05-01 DIAGNOSIS — R921 Mammographic calcification found on diagnostic imaging of breast: Secondary | ICD-10-CM

## 2022-05-01 DIAGNOSIS — R922 Inconclusive mammogram: Secondary | ICD-10-CM | POA: Diagnosis not present

## 2022-05-01 DIAGNOSIS — R928 Other abnormal and inconclusive findings on diagnostic imaging of breast: Secondary | ICD-10-CM

## 2022-05-01 NOTE — Telephone Encounter (Signed)
Split Bed 2 patient requested a Thursday night -[employer] no auth req spoke to [facility] ref # [id number]. Patient is scheduled at [facility] for 05/16/22 at 8 pm.

## 2022-05-22 DIAGNOSIS — H40013 Open angle with borderline findings, low risk, bilateral: Secondary | ICD-10-CM | POA: Diagnosis not present

## 2022-05-22 DIAGNOSIS — H25813 Combined forms of age-related cataract, bilateral: Secondary | ICD-10-CM | POA: Diagnosis not present

## 2022-05-22 DIAGNOSIS — E119 Type 2 diabetes mellitus without complications: Secondary | ICD-10-CM | POA: Diagnosis not present

## 2022-05-22 DIAGNOSIS — H04123 Dry eye syndrome of bilateral lacrimal glands: Secondary | ICD-10-CM | POA: Diagnosis not present

## 2022-05-30 ENCOUNTER — Ambulatory Visit (INDEPENDENT_AMBULATORY_CARE_PROVIDER_SITE_OTHER): Payer: BC Managed Care – PPO | Admitting: Neurology

## 2022-05-30 DIAGNOSIS — G4730 Sleep apnea, unspecified: Secondary | ICD-10-CM

## 2022-05-30 DIAGNOSIS — E119 Type 2 diabetes mellitus without complications: Secondary | ICD-10-CM

## 2022-05-30 DIAGNOSIS — K2100 Gastro-esophageal reflux disease with esophagitis, without bleeding: Secondary | ICD-10-CM

## 2022-05-30 DIAGNOSIS — I480 Paroxysmal atrial fibrillation: Secondary | ICD-10-CM

## 2022-06-04 NOTE — Procedures (Signed)
PATIENT'S NAME:  Mary, Sharp DOB:      14-Oct-1961      MR#:    [id number]     DATE OF RECORDING: 05/30/2022 REFERRING M.D.:  Prince Solian, MD Study Performed:   Baseline Polysomnogram HISTORY:  Mary Sharp is a 61 y.o. female patient who had been last seen here at [facility] in 2016 (!) and received her CPAP machine in June 2014- seen here upon a referral on 04/01/2022 from Dr. Dagmar Hait, MD, PhD.  for a new consultation and new CPAP.  Mary Sharp has used the same machine now for 9 years- set up was May 5th, 2014. 02-19-13, AHI 59.8/h , titration to 10 cm water.  She is meanwhile 61 years old she is still 100% compliant user of her CPAP by days and hours with an average use at time of 7 hours 30 minutes at night.  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  Her residual AHI is great 0.5/h and she has very little air leakage; 95th percentile air leak was 2.1 L a minute. Had a PE, bleeding on anticoagulation, anemia, very frequent NOCTURIA, DM 2, insulin dependent, a fib.     The patient endorsed the Epworth Sleepiness Scale at 4/24 points.  FSS at 13/63 points.  The patient's weight 280 pounds with a height of 64 (inches), resulting in a BMI of 47.8 kg/m2. The patient's neck circumference measured 16.2 inches.  CURRENT MEDICATIONS: Norvasc, Eliquis, Vitamin D3, Welchol, Empagliflozin-Metformin HCI, Allegra, Tresiba Flex touch, Lopressor, Multivitamin, Airborne, Protonix, Pravachol, Micardis, Ubiquinol   PROCEDURE:  This is a multichannel digital polysomnogram utilizing the Somnostar 11.2 system.  Electrodes and sensors were applied and monitored per AASM Specifications.   EEG, EOG, Chin and Limb EMG, were sampled at 200 Hz.  ECG, Snore and Nasal Pressure, Thermal Airflow, Respiratory Effort, CPAP Flow and Pressure, Oximetry was sampled at 50 Hz. Digital video and audio were recorded.      BASELINE STUDY: Lights Out was at 21:10 and Lights On  at 04:14.  Total recording time (TRT) was 424 minutes, with a total sleep time (TST) of 309.5 minutes. The patient's sleep latency was 71 minutes.  REM latency was 0 minutes.  The sleep efficiency was 73.0 %.     SLEEP ARCHITECTURE: WASO (Wake after sleep onset) was 101 minutes.  There were 21 minutes in Stage N1, 285 minutes Stage N2, 3.5 minutes Stage N3 and 0 minutes in Stage REM.  The percentage of Stage N1 was 6.8%, Stage N2 was 92.1%, Stage N3 was 1.1% and Stage R (REM sleep) was 0%.   RESPIRATORY ANALYSIS:  There were a total of 97 respiratory events:  2 obstructive apneas, 0 central apneas and 0 mixed apneas with a total of 2 apneas and an apnea index (AI) of .4 /hour.  There were 95 hypopneas with a hypopnea index of 18.4 /hour.      The total APNEA/HYPOPNEA INDEX (AHI) was 18.8/h.  0 events occurred in REM sleep and 190 events in NREM. The REM AHI was 0 /hour, versus a non-REM AHI of 18.8/h. The patient spent 205 minutes of total sleep time in the supine position and 105 minutes in non-supine.  The supine AHI was 22.3/h  versus a non-supine AHI of 12.1/h.  OXYGEN SATURATION & C02:  The Wake baseline 02 saturation was 94%, with the lowest being 83%. Time spent below  89% saturation equaled 98 minutes.    PERIODIC LIMB MOVEMENTS:  The patient had a total of 0 Periodic Limb Movements.    The arousals were noted as: 86 were spontaneous, 0 were associated with PLMs, 67 were associated with respiratory events. Audio and video analysis did not show any abnormal or unusual movements, behaviors, phonations or vocalizations.   EKG was in keeping with normal sinus rhythm and intermittent PVCs were seen   IMPRESSION:  There is still mild Obstructive Sleep Apnea (OSA) at AHI 18.8/h present. The sleep study did not capture REM sleep which could have increased the AHI further.  There was significant hypoxia for prolonged periods of time, recorded in lateral and supine sleep positions.   EKG with  intermittent PVCs, bradycardia- in response to hypoxia.   RECOMMENDATIONS: I agree with the need of continuation of PAP therapy. auto CPAP setting between 7-15 cm water, 3 cm EPR and heated humidification, patient's favorite interface.   I certify that I have reviewed the entire raw data recording prior to the issuance of this report in accordance with the Standards of Accreditation of the [place] [employer] (AASM)  Larey Seat, MD Medical Director, [facility], [place] of Neurology and Sleep Medicine (Neurology and Sleep Medicine)

## 2022-06-04 NOTE — Progress Notes (Signed)
IMPRESSION:  1. There is still mild Obstructive Sleep Apnea (OSA) at AHI 18.8/h present. The sleep study did not capture REM sleep which could have increased the AHI further.  2. There was significant hypoxia for prolonged periods of time, recorded in lateral and supine sleep positions.   3. EKG with intermittent PVCs, bradycardia- in response to hypoxia.   RECOMMENDATIONS: I agree with the need of continuation of PAP therapy. auto CPAP setting between 7-15 cm water, 3 cm EPR and heated humidification, patient's favorite interface.

## 2022-06-04 NOTE — Addendum Note (Signed)
Addended by: Larey Seat on: 06/04/2022 05:10 PM   Modules accepted: Orders

## 2022-06-05 ENCOUNTER — Telehealth: Payer: Self-pay | Admitting: Neurology

## 2022-06-05 NOTE — Telephone Encounter (Signed)
-----   Message from Larey Seat, MD sent at 06/04/2022  5:10 PM EDT ----- IMPRESSION:  1. There is still mild Obstructive Sleep Apnea (OSA) at AHI 18.8/h present. The sleep study did not capture REM sleep which could have increased the AHI further.  2. There was significant hypoxia for prolonged periods of time, recorded in lateral and supine sleep positions.   3. EKG with intermittent PVCs, bradycardia- in response to hypoxia.   RECOMMENDATIONS: I agree with the need of continuation of PAP therapy. auto CPAP setting between 7-15 cm water, 3 cm EPR and heated humidification, patient's favorite interface.

## 2022-06-05 NOTE — Telephone Encounter (Signed)
I called pt. I advised pt that Dr. Brett Fairy reviewed their sleep study results and found that pt has sleep apnea. Dr. Brett Fairy recommends that pt starts auto CPAP. I reviewed PAP compliance expectations with the pt. Pt is agreeable to starting a CPAP. I advised pt that an order will be sent to a DME, [facility], and [facility] will call the pt within about one week after they file with the pt's insurance. [facility] will show the pt how to use the machine, fit for masks, and troubleshoot the CPAP if needed. A follow up appt was made for insurance purposes with Debbora Presto, NP on 08/20/2022 at 7:45 am as a mychart video visit. Advised this can only be completed as long as her machine as wifi data. A letter with all of this information in it will be mailed to the pt as a reminder. I verified with the pt that the address we have on file is correct. Pt verbalized understanding of results. Pt had no questions at this time but was encouraged to call back if questions arise. I have sent the order to [facility] and have received confirmation that they have received the order.

## 2022-06-11 DIAGNOSIS — M4186 Other forms of scoliosis, lumbar region: Secondary | ICD-10-CM | POA: Diagnosis not present

## 2022-06-11 DIAGNOSIS — R29898 Other symptoms and signs involving the musculoskeletal system: Secondary | ICD-10-CM | POA: Diagnosis not present

## 2022-06-11 DIAGNOSIS — M2578 Osteophyte, vertebrae: Secondary | ICD-10-CM | POA: Diagnosis not present

## 2022-06-11 DIAGNOSIS — M5441 Lumbago with sciatica, right side: Secondary | ICD-10-CM | POA: Diagnosis not present

## 2022-06-11 DIAGNOSIS — M47816 Spondylosis without myelopathy or radiculopathy, lumbar region: Secondary | ICD-10-CM | POA: Diagnosis not present

## 2022-06-11 DIAGNOSIS — M545 Low back pain, unspecified: Secondary | ICD-10-CM | POA: Diagnosis not present

## 2022-06-11 DIAGNOSIS — G8929 Other chronic pain: Secondary | ICD-10-CM | POA: Diagnosis not present

## 2022-06-17 DIAGNOSIS — M5117 Intervertebral disc disorders with radiculopathy, lumbosacral region: Secondary | ICD-10-CM | POA: Diagnosis not present

## 2022-06-17 DIAGNOSIS — M5116 Intervertebral disc disorders with radiculopathy, lumbar region: Secondary | ICD-10-CM | POA: Diagnosis not present

## 2022-06-17 DIAGNOSIS — M5441 Lumbago with sciatica, right side: Secondary | ICD-10-CM | POA: Diagnosis not present

## 2022-06-17 DIAGNOSIS — M48061 Spinal stenosis, lumbar region without neurogenic claudication: Secondary | ICD-10-CM | POA: Diagnosis not present

## 2022-06-24 ENCOUNTER — Ambulatory Visit (INDEPENDENT_AMBULATORY_CARE_PROVIDER_SITE_OTHER): Payer: BC Managed Care – PPO | Admitting: Nurse Practitioner

## 2022-06-24 DIAGNOSIS — Z01810 Encounter for preprocedural cardiovascular examination: Secondary | ICD-10-CM

## 2022-06-24 NOTE — Progress Notes (Signed)
Virtual Visit via Telephone Note   Because of Mary Sharp's co-morbid illnesses, she is at least at moderate risk for complications without adequate follow up.  This format is felt to be most appropriate for this patient at this time.  The patient did not have access to video technology/had technical difficulties with video requiring transitioning to audio format only (telephone).  All issues noted in this document were discussed and addressed.  No physical exam could be performed with this format.  Please refer to the patient's chart for her consent to telehealth for [facility].  Evaluation Performed:  Preoperative cardiovascular risk assessment _____________   Date:  06/24/2022   Patient ID:  Mary Sharp, DOB 08-30-1961, MRN [id number] Patient Location:  Home Provider location:   Office  Primary Care Provider:  Prince Solian, MD Primary Cardiologist:  Kirk Ruths, MD  Chief Complaint / Patient Profile   61 y.o. y/o female with a h/o HTN, HLD, previous PE, DM, chest pain, paroxysmal atrial fibrillation who is pending colonoscopy and presents today for telephonic preoperative cardiovascular risk assessment.  Procedure date: 07/15/2022  Past Medical History    Past Medical History:  Diagnosis Date   Acid reflux    Atrial fibrillation (HCC)    Chest pain    Diabetes mellitus    Elevated cholesterol    Fibroid    Hydrosalpinx    Right   Hypertension    Migraine    Ovarian cyst    Dermoid-Right   Serous cystadenoma-Left   Sleep apnea with use of continuous positive airway pressure (CPAP)     02-19-13 , AHI 59.8 , titration to 10 cm water.    Past Surgical History:  Procedure Laterality Date   ABDOMINAL SURGERY     Expl. Laparotomy   GANGLION CYST EXCISION Right 1986-1987   wrist   KENALOG INJECTION     KNEE ARTHROSCOPY Left 08/20/12   torn meniscus   KNEE SURGERY Right 2004-2005   knee surgery, arthroscopic      sept 2013 , left knee    MYOMECTOMY      Multiple   MYOMECTOMY  1987-1988   OOPHORECTOMY     LSO   SALPINGECTOMY     Right   TONSILLECTOMY  1965   VAGINAL HYSTERECTOMY  1992   partial    Allergies  Allergies  Allergen Reactions   Codeine Nausea And Vomiting   Doxycycline Diarrhea   Hydrocodone-Acetaminophen Nausea Only   Propoxyphene N-Acetaminophen Nausea Only   Morphine Nausea Only and Rash    History of Present Illness    Mary Sharp is a 61 y.o. female who presents via audio/video conferencing for a telehealth visit today.  Pt was last seen in cardiology clinic on 02/05/2022 by Dr. Stanford Breed.  At that time Mary Sharp was doing well and denied any chest pain, palpitations, or syncope.  She was noted to have some elevation in blood pressure however she states it is controlled at home. The patient is now pending procedure as outlined above. Since her last visit, she has been doing well with no new cardiovascular complaints.  She did have an MRI completed due to back pain.  She advised me that she will not require surgery for this back condition.   Home Medications    Prior to Admission medications   Medication Sig Start Date End Date Taking? Authorizing Provider  amLODipine (NORVASC) 10 MG tablet Take 10 mg by mouth every evening.  [provider]  apixaban (ELIQUIS) 5 MG TABS tablet Take 5 mg by mouth 2 (two) times daily.    [provider]  cholecalciferol (VITAMIN D3) 25 MCG (1000 UNIT) tablet Take 2,000 Units by mouth daily.    [provider]  colesevelam (WELCHOL) 625 MG tablet Take 625 mg by mouth 2 (two) times daily with a meal.    [provider]  Empagliflozin-metFORMIN HCl 12.03-999 MG TABS Take 1 tablet by mouth 2 (two) times daily.    [provider]  Eyelid Cleansers (OCUSOFT EYELID CLEANSING) PADS Place 1 application into both eyes every morning.    [provider]  fexofenadine (ALLEGRA) 180 MG tablet Take 180 mg by mouth every morning.     [provider]  insulin degludec (TRESIBA FLEXTOUCH) 100 UNIT/ML SOPN FlexTouch Pen Inject 75 Units into the skin every morning.    [provider]  metoprolol (LOPRESSOR) 50 MG tablet Take 50 mg by mouth 2 (two) times daily.    [provider]  Multiple Vitamin (MULTIVITAMIN) tablet Take 1 tablet by mouth daily.    [provider]  Multiple Vitamins-Minerals (AIRBORNE) CHEW Chew 1 tablet by mouth daily.    [provider]  pantoprazole (PROTONIX) 40 MG tablet Take 40 mg by mouth daily. 01/05/20   [provider]  pravastatin (PRAVACHOL) 40 MG tablet Take 40 mg by mouth every morning.     [provider]  telmisartan (MICARDIS) 80 MG tablet Take 80 mg by mouth daily. 11/24/19   [provider]  Ubiquinol 100 MG CAPS Take 100 mg by mouth every morning.    [provider]    Physical Exam    Vital Signs:  Mary Sharp does not have vital signs available for review today.  No vitals for today  Given telephonic nature of communication, physical exam is limited. AAOx3. NAD. Normal affect.  Speech and respirations are unlabored.  Accessory Clinical Findings    None  Assessment & Plan    1.  Preoperative Cardiovascular Risk Assessment:   :[id number]} Mary Sharp's perioperative risk of a major cardiac event is 6.6% according to the Revised Cardiac Risk Index (RCRI).  Therefore, she is at high risk for perioperative complications.   Her functional capacity is good at 5.07 METs according to the Duke Activity Status Index (DASI). Recommendations: According to ACC/AHA guidelines, no further cardiovascular testing needed.  The patient may proceed to surgery at acceptable risk.   Antiplatelet and/or Anticoagulation Recommendations:  Eliquis (Apixaban) can be held for 2 days prior to surgery.  Please resume post op when felt to be safe.      -Hold Eliquis 2 days prior to procedure  A copy of this note will be routed  to requesting surgeon.  Time:   Today, I have spent 9 minutes with the patient with telehealth technology discussing medical history, symptoms, and management plan.     Mable Fill, Marissa Nestle, NP  06/24/2022, 9:06 AM

## 2022-06-27 DIAGNOSIS — G4733 Obstructive sleep apnea (adult) (pediatric): Secondary | ICD-10-CM | POA: Diagnosis not present

## 2022-06-27 DIAGNOSIS — M5441 Lumbago with sciatica, right side: Secondary | ICD-10-CM | POA: Diagnosis not present

## 2022-06-27 DIAGNOSIS — G8929 Other chronic pain: Secondary | ICD-10-CM | POA: Diagnosis not present

## 2022-07-01 DIAGNOSIS — G8929 Other chronic pain: Secondary | ICD-10-CM | POA: Diagnosis not present

## 2022-07-01 DIAGNOSIS — M5441 Lumbago with sciatica, right side: Secondary | ICD-10-CM | POA: Diagnosis not present

## 2022-07-03 DIAGNOSIS — G8929 Other chronic pain: Secondary | ICD-10-CM | POA: Diagnosis not present

## 2022-07-03 DIAGNOSIS — M5441 Lumbago with sciatica, right side: Secondary | ICD-10-CM | POA: Diagnosis not present

## 2022-07-08 DIAGNOSIS — M5441 Lumbago with sciatica, right side: Secondary | ICD-10-CM | POA: Diagnosis not present

## 2022-07-08 DIAGNOSIS — G8929 Other chronic pain: Secondary | ICD-10-CM | POA: Diagnosis not present

## 2022-07-10 DIAGNOSIS — M5441 Lumbago with sciatica, right side: Secondary | ICD-10-CM | POA: Diagnosis not present

## 2022-07-10 DIAGNOSIS — G8929 Other chronic pain: Secondary | ICD-10-CM | POA: Diagnosis not present

## 2022-07-15 DIAGNOSIS — K5730 Diverticulosis of large intestine without perforation or abscess without bleeding: Secondary | ICD-10-CM | POA: Diagnosis not present

## 2022-07-15 DIAGNOSIS — K621 Rectal polyp: Secondary | ICD-10-CM | POA: Diagnosis not present

## 2022-07-15 DIAGNOSIS — Z1211 Encounter for screening for malignant neoplasm of colon: Secondary | ICD-10-CM | POA: Diagnosis not present

## 2022-07-15 DIAGNOSIS — K649 Unspecified hemorrhoids: Secondary | ICD-10-CM | POA: Diagnosis not present

## 2022-07-17 DIAGNOSIS — M5441 Lumbago with sciatica, right side: Secondary | ICD-10-CM | POA: Diagnosis not present

## 2022-07-17 DIAGNOSIS — G8929 Other chronic pain: Secondary | ICD-10-CM | POA: Diagnosis not present

## 2022-07-18 DIAGNOSIS — G8929 Other chronic pain: Secondary | ICD-10-CM | POA: Diagnosis not present

## 2022-07-18 DIAGNOSIS — M533 Sacrococcygeal disorders, not elsewhere classified: Secondary | ICD-10-CM | POA: Diagnosis not present

## 2022-07-18 DIAGNOSIS — M5441 Lumbago with sciatica, right side: Secondary | ICD-10-CM | POA: Diagnosis not present

## 2022-07-19 DIAGNOSIS — G8929 Other chronic pain: Secondary | ICD-10-CM | POA: Diagnosis not present

## 2022-07-19 DIAGNOSIS — M5441 Lumbago with sciatica, right side: Secondary | ICD-10-CM | POA: Diagnosis not present

## 2022-07-24 DIAGNOSIS — M5441 Lumbago with sciatica, right side: Secondary | ICD-10-CM | POA: Diagnosis not present

## 2022-07-24 DIAGNOSIS — G8929 Other chronic pain: Secondary | ICD-10-CM | POA: Diagnosis not present

## 2022-07-26 DIAGNOSIS — G8929 Other chronic pain: Secondary | ICD-10-CM | POA: Diagnosis not present

## 2022-07-26 DIAGNOSIS — M5441 Lumbago with sciatica, right side: Secondary | ICD-10-CM | POA: Diagnosis not present

## 2022-07-28 DIAGNOSIS — G4733 Obstructive sleep apnea (adult) (pediatric): Secondary | ICD-10-CM | POA: Diagnosis not present

## 2022-07-31 DIAGNOSIS — M5441 Lumbago with sciatica, right side: Secondary | ICD-10-CM | POA: Diagnosis not present

## 2022-07-31 DIAGNOSIS — G8929 Other chronic pain: Secondary | ICD-10-CM | POA: Diagnosis not present

## 2022-08-02 DIAGNOSIS — M5441 Lumbago with sciatica, right side: Secondary | ICD-10-CM | POA: Diagnosis not present

## 2022-08-02 DIAGNOSIS — G8929 Other chronic pain: Secondary | ICD-10-CM | POA: Diagnosis not present

## 2022-08-05 DIAGNOSIS — M5441 Lumbago with sciatica, right side: Secondary | ICD-10-CM | POA: Diagnosis not present

## 2022-08-05 DIAGNOSIS — G8929 Other chronic pain: Secondary | ICD-10-CM | POA: Diagnosis not present

## 2022-08-07 ENCOUNTER — Telehealth: Payer: Self-pay | Admitting: Cardiology

## 2022-08-07 DIAGNOSIS — M5441 Lumbago with sciatica, right side: Secondary | ICD-10-CM | POA: Diagnosis not present

## 2022-08-07 DIAGNOSIS — G8929 Other chronic pain: Secondary | ICD-10-CM | POA: Diagnosis not present

## 2022-08-07 NOTE — Telephone Encounter (Signed)
Patient with diagnosis of afib and bilateral PE and DVT Feb 2021 on Eliquis for anticoagulation. Was not on anticoagulation at the time of her VTE, has not had any recurrence.  Procedure:  S1 JOINT INJECTION Date of procedure: 08/09/22   CHA2DS2-VASc Score = 4   This indicates a 4.8% annual risk of stroke. The patient's score is based upon: CHF History: 0 HTN History: 1 Diabetes History: 1 Stroke History: 0 Vascular Disease History: 1 Age Score: 0 Gender Score: 1      CrCl 99 ml/min  Per office protocol, patient can hold Eliquis for 3 days prior to procedure.   Given the fact that procedure is in 2 days, please confirm that patient held yesterday as well as today.  **This guidance is not considered finalized until pre-operative APP has relayed final recommendations.**

## 2022-08-07 NOTE — Telephone Encounter (Signed)
Caller is following up on patient's pre-op clearance for surgery scheduled on 9/15.  Caller stated the patient will need to be off Eliquis for 3 days and will need signature on documentation they faxed on 8/29, 9/12 and 9/13.

## 2022-08-07 NOTE — Telephone Encounter (Signed)
   Pre-operative Risk Assessment    Patient Name: Mary Sharp  DOB: [birth year] MRN: [id number]      Request for Surgical Clearance    Procedure:   S1 JOINT INJECTION  Date of Surgery:  Clearance 08/09/22                                 Surgeon:  DR. [facility] Surgeon's Group or Practice Name:  [facility] Phone number:  [phone] Fax number:  [phone]   Type of Clearance Requested:   - Medical  - Pharmacy:  Hold Apixaban (Eliquis) x 3 DAYS PRIOR   Type of Anesthesia:  Not Indicated   Additional requests/questions:    Jiles Prows   08/07/2022, 2:44 PM

## 2022-08-07 NOTE — Telephone Encounter (Signed)
   Patient Name: Mary Sharp  DOB: [birth year] MRN: [id number]  Primary Cardiologist: Kirk Ruths, MD  Chart reviewed as part of pre-operative protocol coverage.   Attempted to call patient to discuss clearance. Spoke with patient's husband who states patient is at an appointment.  Called patient again and left voicemail for patient to call and speak with preop covering APP tomorrow to discuss final clearance.   I confirmed with patient's husband (DPR on file) that patient has held her Eliquis since Monday.  Lenna Sciara, NP 08/07/2022, 4:59 PM

## 2022-08-07 NOTE — Telephone Encounter (Signed)
I called and left message for [employer]. That I do not see any clearance in the chart from their office. Left message to fax clearance to [phone] attn: pre op team Anyra Kaufman . Once I have request I will have pre op provider review.

## 2022-08-08 NOTE — Telephone Encounter (Signed)
   Patient Name: Mary Sharp  DOB: 26-Oct-1961 MRN: [id number]  Primary Cardiologist: Kirk Ruths, MD  Chart reviewed as part of pre-operative protocol coverage. Patient had a clearance for unrelated procedure within the last 2 months therefore given protocol, cleared for present procedure as well. Per pharmD, Per office protocol, patient can hold Eliquis for 3 days prior to procedure. Raquel Sarna, NP, spoke with pt's husband yesterday to confirm that patient was already holding. We do not need to speak with the patient when she calls back. Will route this bundled recommendation to requesting provider via Epic fax function. Please call with questions.  Charlie Pitter, PA-C 08/08/2022, 8:12 AM

## 2022-08-09 DIAGNOSIS — M533 Sacrococcygeal disorders, not elsewhere classified: Secondary | ICD-10-CM | POA: Diagnosis not present

## 2022-08-09 DIAGNOSIS — M791 Myalgia, unspecified site: Secondary | ICD-10-CM | POA: Diagnosis not present

## 2022-08-10 DIAGNOSIS — T23201A Burn of second degree of right hand, unspecified site, initial encounter: Secondary | ICD-10-CM | POA: Diagnosis not present

## 2022-08-10 DIAGNOSIS — T310 Burns involving less than 10% of body surface: Secondary | ICD-10-CM | POA: Diagnosis not present

## 2022-08-12 DIAGNOSIS — G8929 Other chronic pain: Secondary | ICD-10-CM | POA: Diagnosis not present

## 2022-08-12 DIAGNOSIS — M5441 Lumbago with sciatica, right side: Secondary | ICD-10-CM | POA: Diagnosis not present

## 2022-08-13 DIAGNOSIS — J069 Acute upper respiratory infection, unspecified: Secondary | ICD-10-CM | POA: Diagnosis not present

## 2022-08-13 DIAGNOSIS — Z20822 Contact with and (suspected) exposure to covid-19: Secondary | ICD-10-CM | POA: Diagnosis not present

## 2022-08-13 DIAGNOSIS — M791 Myalgia, unspecified site: Secondary | ICD-10-CM | POA: Diagnosis not present

## 2022-08-13 DIAGNOSIS — R059 Cough, unspecified: Secondary | ICD-10-CM | POA: Diagnosis not present

## 2022-08-15 NOTE — Progress Notes (Signed)
PATIENT: Mary Sharp DOB: 1961/04/14  REASON FOR VISIT: follow up HISTORY FROM: patient  Virtual Visit via Telephone Note  I connected with Mary Sharp on 08/20/22 at  8:15 AM EDT by telephone and verified that I am speaking with the correct person using two identifiers.   I discussed the limitations, risks, security and privacy concerns of performing an evaluation and management service by telephone and the availability of in person appointments. I also discussed with the patient that there may be a patient responsible charge related to this service. The patient expressed understanding and agreed to proceed.   History of Present Illness:  08/20/22 ALL:  Mary Sharp is a 61 y.o. female here today for follow up for OSA on CPAP. She was seen in consult with Dr Brett Fairy 03/2022 for a new CPAP machine. PSG showed mild OSA with total AHI of 18.8/h. AutoPAP was ordered. She reports doing well. She is adjusting to her new machine. She denies concerns with machine or supplies.     HISTORY: (copied from Dr Valentina Shaggy previous note)  Mary Sharp is a 61 y.o. female patient who had been last seen here in 2016 (!) and received her CPAP machine in June 2014- seen here upon a referral on 04/01/2022 from Dr. Dagmar Hait, MD, PhD.  for a new consultation and new CPAP. Marland Kitchen  Chief concern according to patient :  " my machine is displaying a message that it needs to be replaced."   Mrs. Mary Sharp has used the same machine now for 9 years set up was Mar 29, 2013.Marland Kitchen  She is meanwhile 61 years old she is still 100% compliant user of her CPAP by days and hours with an average use at time of 7 hours 30 minutes at night.  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  Her residual AHI is green at 0.515/h and she has very little air leakage 95th percentile air leak was 2.1 L a minute.   Mary Sharp  has a past medical history of Acid reflux, Atrial  fibrillation ([place]), Chest pain, GERD, Diabetes mellitus, Elevated cholesterol, Fibroid, Hydrosalpinx, Hypertension, Migraine, Ovarian cyst, and OSA on CPAP ( 2014) Sleep apnea with use of continuous positive airway pressure (CPAP). In 1/ 2015 admitted for PE- and unproved deep vein origin, doppler were normal.  ED report in epic.    The patient had the first sleep study in the year 2014 at [facility]   with a result of an AHI ( Apnea Hypopnea index)  of 59.8/h .      Sleep apnea with use of continuous positive airway pressure (CPAP)           02-19-13 , AHI 59.8 , titration to 10 cm water.     SPLIT night titration was to 10 cm water pressure , using an nasal mask.   Sleep relevant medical history: Nocturia every 2 hours 3-4 times each night - while on CPAP,,    Family medical /sleep history: no other family member on CPAP with OSA, insomnia, sleep walkers.    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  The patient currently works daytime. Pets are not present. Tobacco use- none since 1996   ETOH use; none ,  Caffeine intake in form of Coffee( 2 cups in AM ) .   Sleep habits are as follows: The patient's dinner time is between 6-7 PM. The patient goes to bed at 9 PM and continues to sleep for 2 hour intervals, 3 times  wakes for  bathroom breaks, the first time at 12 AM.   The preferred sleep position is supine, with the support of 1 pillow , on a recliner  Dreams are reportedly rare.  4  AM is the usual rise time. The patient wakes up spontaneously. Work starts at 7 AM.  She reports not feeling refreshed or restored in AM, with symptoms such as dry mouth ( rarely ) , but no morning headaches, and residual fatigue.  Naps are taken infrequently.    Observations/Objective:  Generalized: Well developed, in no acute distress  Mentation: Alert oriented  to time, place, history taking. Follows all commands speech and language fluent   Assessment and Plan:  61 y.o. year old female  has a past medical history of Acid reflux, Atrial fibrillation ([place]), Chest pain, Diabetes mellitus, Elevated cholesterol, Fibroid, Hydrosalpinx, Hypertension, Migraine, Ovarian cyst, and Sleep apnea with use of continuous positive airway pressure (CPAP). here with    ICD-10-CM   1. Sleep apnea with use of continuous positive airway pressure (CPAP)  G47.30      Mary Sharp is doing very well on therapy. Compliance report shows excellent compliance. She will continue CPAP nightly for at least 4 hours. Healthy lifestyle habits encouraged. She will follow up with me in 1 year.    No orders of the defined types were placed in this encounter.   No orders of the defined types were placed in this encounter.    Follow Up Instructions:  I discussed the assessment and treatment plan with the patient. The patient was provided an opportunity to ask questions and all were answered. The patient agreed with the plan and demonstrated an understanding of the instructions.   The patient was advised to call back or seek an in-person evaluation if the symptoms worsen or if the condition fails to improve as anticipated.  I provided 15 minutes of non-face-to-face time during this encounter. Patient located at their place of residence during [place] visit. Provider is in the office.    Debbora Presto, NP

## 2022-08-15 NOTE — Patient Instructions (Signed)

## 2022-08-19 ENCOUNTER — Encounter: Payer: Self-pay | Admitting: *Deleted

## 2022-08-20 ENCOUNTER — Telehealth: Payer: BC Managed Care – PPO | Admitting: Family Medicine

## 2022-08-20 ENCOUNTER — Encounter: Payer: Self-pay | Admitting: Family Medicine

## 2022-08-20 DIAGNOSIS — G4730 Sleep apnea, unspecified: Secondary | ICD-10-CM

## 2022-08-27 DIAGNOSIS — G4733 Obstructive sleep apnea (adult) (pediatric): Secondary | ICD-10-CM | POA: Diagnosis not present

## 2022-08-28 DIAGNOSIS — J069 Acute upper respiratory infection, unspecified: Secondary | ICD-10-CM | POA: Diagnosis not present

## 2022-08-30 DIAGNOSIS — M5441 Lumbago with sciatica, right side: Secondary | ICD-10-CM | POA: Diagnosis not present

## 2022-08-30 DIAGNOSIS — G8929 Other chronic pain: Secondary | ICD-10-CM | POA: Diagnosis not present

## 2022-09-02 DIAGNOSIS — M5441 Lumbago with sciatica, right side: Secondary | ICD-10-CM | POA: Diagnosis not present

## 2022-09-02 DIAGNOSIS — E1165 Type 2 diabetes mellitus with hyperglycemia: Secondary | ICD-10-CM | POA: Diagnosis not present

## 2022-09-02 DIAGNOSIS — I10 Essential (primary) hypertension: Secondary | ICD-10-CM | POA: Diagnosis not present

## 2022-09-02 DIAGNOSIS — G8929 Other chronic pain: Secondary | ICD-10-CM | POA: Diagnosis not present

## 2022-09-02 DIAGNOSIS — Z23 Encounter for immunization: Secondary | ICD-10-CM | POA: Diagnosis not present

## 2022-09-04 DIAGNOSIS — M5441 Lumbago with sciatica, right side: Secondary | ICD-10-CM | POA: Diagnosis not present

## 2022-09-04 DIAGNOSIS — G8929 Other chronic pain: Secondary | ICD-10-CM | POA: Diagnosis not present

## 2022-09-06 DIAGNOSIS — M7061 Trochanteric bursitis, right hip: Secondary | ICD-10-CM | POA: Diagnosis not present

## 2022-09-09 DIAGNOSIS — M5441 Lumbago with sciatica, right side: Secondary | ICD-10-CM | POA: Diagnosis not present

## 2022-09-09 DIAGNOSIS — G8929 Other chronic pain: Secondary | ICD-10-CM | POA: Diagnosis not present

## 2022-09-11 DIAGNOSIS — G8929 Other chronic pain: Secondary | ICD-10-CM | POA: Diagnosis not present

## 2022-09-11 DIAGNOSIS — M5441 Lumbago with sciatica, right side: Secondary | ICD-10-CM | POA: Diagnosis not present

## 2022-09-16 DIAGNOSIS — M5441 Lumbago with sciatica, right side: Secondary | ICD-10-CM | POA: Diagnosis not present

## 2022-09-16 DIAGNOSIS — G8929 Other chronic pain: Secondary | ICD-10-CM | POA: Diagnosis not present

## 2022-09-20 DIAGNOSIS — M7061 Trochanteric bursitis, right hip: Secondary | ICD-10-CM | POA: Diagnosis not present

## 2022-09-23 DIAGNOSIS — G8929 Other chronic pain: Secondary | ICD-10-CM | POA: Diagnosis not present

## 2022-09-23 DIAGNOSIS — M5441 Lumbago with sciatica, right side: Secondary | ICD-10-CM | POA: Diagnosis not present

## 2022-09-25 DIAGNOSIS — G8929 Other chronic pain: Secondary | ICD-10-CM | POA: Diagnosis not present

## 2022-09-25 DIAGNOSIS — E119 Type 2 diabetes mellitus without complications: Secondary | ICD-10-CM | POA: Diagnosis not present

## 2022-09-25 DIAGNOSIS — M5441 Lumbago with sciatica, right side: Secondary | ICD-10-CM | POA: Diagnosis not present

## 2022-09-27 DIAGNOSIS — G4733 Obstructive sleep apnea (adult) (pediatric): Secondary | ICD-10-CM | POA: Diagnosis not present

## 2022-09-30 DIAGNOSIS — G8929 Other chronic pain: Secondary | ICD-10-CM | POA: Diagnosis not present

## 2022-09-30 DIAGNOSIS — M5441 Lumbago with sciatica, right side: Secondary | ICD-10-CM | POA: Diagnosis not present

## 2022-10-07 DIAGNOSIS — M5441 Lumbago with sciatica, right side: Secondary | ICD-10-CM | POA: Diagnosis not present

## 2022-10-07 DIAGNOSIS — G8929 Other chronic pain: Secondary | ICD-10-CM | POA: Diagnosis not present

## 2022-10-09 DIAGNOSIS — M5441 Lumbago with sciatica, right side: Secondary | ICD-10-CM | POA: Diagnosis not present

## 2022-10-09 DIAGNOSIS — G8929 Other chronic pain: Secondary | ICD-10-CM | POA: Diagnosis not present

## 2022-10-25 DIAGNOSIS — E119 Type 2 diabetes mellitus without complications: Secondary | ICD-10-CM | POA: Diagnosis not present

## 2022-11-01 ENCOUNTER — Other Ambulatory Visit: Payer: Self-pay | Admitting: Internal Medicine

## 2022-11-01 ENCOUNTER — Ambulatory Visit
Admission: RE | Admit: 2022-11-01 | Discharge: 2022-11-01 | Disposition: A | Discharge status: Home / Self Care | Payer: BC Managed Care – PPO | Source: Ambulatory Visit | Attending: Internal Medicine | Admitting: Internal Medicine

## 2022-11-01 DIAGNOSIS — R921 Mammographic calcification found on diagnostic imaging of breast: Secondary | ICD-10-CM | POA: Diagnosis not present

## 2022-11-12 DIAGNOSIS — H00024 Hordeolum internum left upper eyelid: Secondary | ICD-10-CM | POA: Diagnosis not present

## 2022-11-23 DIAGNOSIS — R509 Fever, unspecified: Secondary | ICD-10-CM | POA: Diagnosis not present

## 2022-11-23 DIAGNOSIS — R110 Nausea: Secondary | ICD-10-CM | POA: Diagnosis not present

## 2022-11-23 DIAGNOSIS — R059 Cough, unspecified: Secondary | ICD-10-CM | POA: Diagnosis not present

## 2022-11-23 DIAGNOSIS — J029 Acute pharyngitis, unspecified: Secondary | ICD-10-CM | POA: Diagnosis not present

## 2022-11-23 DIAGNOSIS — R519 Headache, unspecified: Secondary | ICD-10-CM | POA: Diagnosis not present

## 2022-11-25 DIAGNOSIS — E119 Type 2 diabetes mellitus without complications: Secondary | ICD-10-CM | POA: Diagnosis not present

## 2022-12-26 DIAGNOSIS — E119 Type 2 diabetes mellitus without complications: Secondary | ICD-10-CM | POA: Diagnosis not present

## 2023-01-24 DIAGNOSIS — E119 Type 2 diabetes mellitus without complications: Secondary | ICD-10-CM | POA: Diagnosis not present

## 2023-01-30 DIAGNOSIS — M8580 Other specified disorders of bone density and structure, unspecified site: Secondary | ICD-10-CM | POA: Diagnosis not present

## 2023-01-30 DIAGNOSIS — E119 Type 2 diabetes mellitus without complications: Secondary | ICD-10-CM | POA: Diagnosis not present

## 2023-01-30 DIAGNOSIS — K219 Gastro-esophageal reflux disease without esophagitis: Secondary | ICD-10-CM | POA: Diagnosis not present

## 2023-01-30 DIAGNOSIS — R7989 Other specified abnormal findings of blood chemistry: Secondary | ICD-10-CM | POA: Diagnosis not present

## 2023-01-30 DIAGNOSIS — E785 Hyperlipidemia, unspecified: Secondary | ICD-10-CM | POA: Diagnosis not present

## 2023-02-05 ENCOUNTER — Other Ambulatory Visit: Payer: Self-pay | Admitting: Internal Medicine

## 2023-02-05 DIAGNOSIS — Z0000 Encounter for general adult medical examination without abnormal findings: Secondary | ICD-10-CM | POA: Diagnosis not present

## 2023-02-05 DIAGNOSIS — I10 Essential (primary) hypertension: Secondary | ICD-10-CM | POA: Diagnosis not present

## 2023-02-05 DIAGNOSIS — Z23 Encounter for immunization: Secondary | ICD-10-CM | POA: Diagnosis not present

## 2023-02-05 DIAGNOSIS — I480 Paroxysmal atrial fibrillation: Secondary | ICD-10-CM | POA: Diagnosis not present

## 2023-02-05 DIAGNOSIS — E0590 Thyrotoxicosis, unspecified without thyrotoxic crisis or storm: Secondary | ICD-10-CM

## 2023-02-05 DIAGNOSIS — E1165 Type 2 diabetes mellitus with hyperglycemia: Secondary | ICD-10-CM | POA: Diagnosis not present

## 2023-02-07 DIAGNOSIS — R059 Cough, unspecified: Secondary | ICD-10-CM | POA: Diagnosis not present

## 2023-02-07 DIAGNOSIS — R0981 Nasal congestion: Secondary | ICD-10-CM | POA: Diagnosis not present

## 2023-02-07 DIAGNOSIS — R509 Fever, unspecified: Secondary | ICD-10-CM | POA: Diagnosis not present

## 2023-02-07 DIAGNOSIS — J069 Acute upper respiratory infection, unspecified: Secondary | ICD-10-CM | POA: Diagnosis not present

## 2023-02-10 DIAGNOSIS — R509 Fever, unspecified: Secondary | ICD-10-CM | POA: Diagnosis not present

## 2023-02-10 DIAGNOSIS — R0981 Nasal congestion: Secondary | ICD-10-CM | POA: Diagnosis not present

## 2023-02-10 DIAGNOSIS — R051 Acute cough: Secondary | ICD-10-CM | POA: Diagnosis not present

## 2023-02-18 DIAGNOSIS — G4733 Obstructive sleep apnea (adult) (pediatric): Secondary | ICD-10-CM | POA: Diagnosis not present

## 2023-02-24 DIAGNOSIS — E119 Type 2 diabetes mellitus without complications: Secondary | ICD-10-CM | POA: Diagnosis not present

## 2023-03-05 ENCOUNTER — Ambulatory Visit
Admission: RE | Admit: 2023-03-05 | Discharge: 2023-03-05 | Disposition: A | Discharge status: Home / Self Care | Payer: BC Managed Care – PPO | Source: Ambulatory Visit | Attending: Internal Medicine | Admitting: Internal Medicine

## 2023-03-05 DIAGNOSIS — E0590 Thyrotoxicosis, unspecified without thyrotoxic crisis or storm: Secondary | ICD-10-CM

## 2023-03-05 DIAGNOSIS — E041 Nontoxic single thyroid nodule: Secondary | ICD-10-CM | POA: Diagnosis not present

## 2023-03-19 ENCOUNTER — Encounter: Payer: Self-pay | Admitting: Internal Medicine

## 2023-03-19 DIAGNOSIS — M542 Cervicalgia: Secondary | ICD-10-CM | POA: Diagnosis not present

## 2023-03-19 DIAGNOSIS — M47812 Spondylosis without myelopathy or radiculopathy, cervical region: Secondary | ICD-10-CM | POA: Diagnosis not present

## 2023-03-20 ENCOUNTER — Other Ambulatory Visit: Payer: Self-pay | Admitting: Internal Medicine

## 2023-03-20 DIAGNOSIS — E0590 Thyrotoxicosis, unspecified without thyrotoxic crisis or storm: Secondary | ICD-10-CM

## 2023-03-26 DIAGNOSIS — E119 Type 2 diabetes mellitus without complications: Secondary | ICD-10-CM | POA: Diagnosis not present

## 2023-03-27 DIAGNOSIS — M1711 Unilateral primary osteoarthritis, right knee: Secondary | ICD-10-CM | POA: Diagnosis not present

## 2023-04-02 DIAGNOSIS — M5416 Radiculopathy, lumbar region: Secondary | ICD-10-CM | POA: Diagnosis not present

## 2023-04-02 DIAGNOSIS — M5136 Other intervertebral disc degeneration, lumbar region: Secondary | ICD-10-CM | POA: Diagnosis not present

## 2023-04-02 DIAGNOSIS — M5412 Radiculopathy, cervical region: Secondary | ICD-10-CM | POA: Diagnosis not present

## 2023-04-03 DIAGNOSIS — M1711 Unilateral primary osteoarthritis, right knee: Secondary | ICD-10-CM | POA: Diagnosis not present

## 2023-04-07 DIAGNOSIS — M5412 Radiculopathy, cervical region: Secondary | ICD-10-CM | POA: Diagnosis not present

## 2023-04-08 DIAGNOSIS — R051 Acute cough: Secondary | ICD-10-CM | POA: Diagnosis not present

## 2023-04-08 DIAGNOSIS — R519 Headache, unspecified: Secondary | ICD-10-CM | POA: Diagnosis not present

## 2023-04-08 DIAGNOSIS — R509 Fever, unspecified: Secondary | ICD-10-CM | POA: Diagnosis not present

## 2023-04-08 DIAGNOSIS — R070 Pain in throat: Secondary | ICD-10-CM | POA: Diagnosis not present

## 2023-04-15 DIAGNOSIS — E1165 Type 2 diabetes mellitus with hyperglycemia: Secondary | ICD-10-CM | POA: Diagnosis not present

## 2023-04-16 DIAGNOSIS — M5412 Radiculopathy, cervical region: Secondary | ICD-10-CM | POA: Diagnosis not present

## 2023-04-17 DIAGNOSIS — M1711 Unilateral primary osteoarthritis, right knee: Secondary | ICD-10-CM | POA: Diagnosis not present

## 2023-04-22 DIAGNOSIS — M5412 Radiculopathy, cervical region: Secondary | ICD-10-CM | POA: Diagnosis not present

## 2023-04-24 ENCOUNTER — Other Ambulatory Visit (HOSPITAL_COMMUNITY)
Admission: RE | Admit: 2023-04-24 | Discharge: 2023-04-24 | Disposition: A | Discharge status: Home / Self Care | Payer: BC Managed Care – PPO | Source: Ambulatory Visit | Attending: Internal Medicine | Admitting: Internal Medicine

## 2023-04-24 ENCOUNTER — Ambulatory Visit
Admission: RE | Admit: 2023-04-24 | Discharge: 2023-04-24 | Disposition: A | Discharge status: Home / Self Care | Payer: BC Managed Care – PPO | Source: Ambulatory Visit | Attending: Internal Medicine | Admitting: Internal Medicine

## 2023-04-24 DIAGNOSIS — E0590 Thyrotoxicosis, unspecified without thyrotoxic crisis or storm: Secondary | ICD-10-CM

## 2023-04-24 DIAGNOSIS — E041 Nontoxic single thyroid nodule: Secondary | ICD-10-CM | POA: Insufficient documentation

## 2023-04-24 DIAGNOSIS — M5412 Radiculopathy, cervical region: Secondary | ICD-10-CM | POA: Diagnosis not present

## 2023-04-28 DIAGNOSIS — M5412 Radiculopathy, cervical region: Secondary | ICD-10-CM | POA: Diagnosis not present

## 2023-04-28 LAB — CYTOLOGY - NON PAP

## 2023-04-30 DIAGNOSIS — M5412 Radiculopathy, cervical region: Secondary | ICD-10-CM | POA: Diagnosis not present

## 2023-05-05 ENCOUNTER — Ambulatory Visit
Admission: RE | Admit: 2023-05-05 | Discharge: 2023-05-05 | Disposition: A | Discharge status: Home / Self Care | Payer: BC Managed Care – PPO | Source: Ambulatory Visit | Attending: Internal Medicine | Admitting: Internal Medicine

## 2023-05-05 DIAGNOSIS — R921 Mammographic calcification found on diagnostic imaging of breast: Secondary | ICD-10-CM

## 2023-05-05 DIAGNOSIS — M5412 Radiculopathy, cervical region: Secondary | ICD-10-CM | POA: Diagnosis not present

## 2023-05-07 DIAGNOSIS — M5412 Radiculopathy, cervical region: Secondary | ICD-10-CM | POA: Diagnosis not present

## 2023-05-12 DIAGNOSIS — M5412 Radiculopathy, cervical region: Secondary | ICD-10-CM | POA: Diagnosis not present

## 2023-05-14 DIAGNOSIS — M542 Cervicalgia: Secondary | ICD-10-CM | POA: Diagnosis not present

## 2023-05-19 DIAGNOSIS — M5412 Radiculopathy, cervical region: Secondary | ICD-10-CM | POA: Diagnosis not present

## 2023-05-21 DIAGNOSIS — G4733 Obstructive sleep apnea (adult) (pediatric): Secondary | ICD-10-CM | POA: Diagnosis not present

## 2023-05-21 DIAGNOSIS — M5412 Radiculopathy, cervical region: Secondary | ICD-10-CM | POA: Diagnosis not present

## 2023-05-26 DIAGNOSIS — M5412 Radiculopathy, cervical region: Secondary | ICD-10-CM | POA: Diagnosis not present

## 2023-05-27 DIAGNOSIS — M5416 Radiculopathy, lumbar region: Secondary | ICD-10-CM | POA: Diagnosis not present

## 2023-06-02 DIAGNOSIS — M5412 Radiculopathy, cervical region: Secondary | ICD-10-CM | POA: Diagnosis not present

## 2023-06-03 DIAGNOSIS — R200 Anesthesia of skin: Secondary | ICD-10-CM | POA: Diagnosis not present

## 2023-06-03 DIAGNOSIS — M5416 Radiculopathy, lumbar region: Secondary | ICD-10-CM | POA: Diagnosis not present

## 2023-06-04 DIAGNOSIS — M5412 Radiculopathy, cervical region: Secondary | ICD-10-CM | POA: Diagnosis not present

## 2023-06-09 DIAGNOSIS — M5412 Radiculopathy, cervical region: Secondary | ICD-10-CM | POA: Diagnosis not present

## 2023-06-10 DIAGNOSIS — E0590 Thyrotoxicosis, unspecified without thyrotoxic crisis or storm: Secondary | ICD-10-CM | POA: Diagnosis not present

## 2023-06-10 DIAGNOSIS — E1165 Type 2 diabetes mellitus with hyperglycemia: Secondary | ICD-10-CM | POA: Diagnosis not present

## 2023-06-10 DIAGNOSIS — I10 Essential (primary) hypertension: Secondary | ICD-10-CM | POA: Diagnosis not present

## 2023-06-12 DIAGNOSIS — M5416 Radiculopathy, lumbar region: Secondary | ICD-10-CM | POA: Diagnosis not present

## 2023-06-13 DIAGNOSIS — G5602 Carpal tunnel syndrome, left upper limb: Secondary | ICD-10-CM | POA: Diagnosis not present

## 2023-06-13 DIAGNOSIS — G5603 Carpal tunnel syndrome, bilateral upper limbs: Secondary | ICD-10-CM | POA: Diagnosis not present

## 2023-06-13 DIAGNOSIS — G5601 Carpal tunnel syndrome, right upper limb: Secondary | ICD-10-CM | POA: Diagnosis not present

## 2023-06-25 DIAGNOSIS — M5412 Radiculopathy, cervical region: Secondary | ICD-10-CM | POA: Diagnosis not present

## 2023-06-30 DIAGNOSIS — M5412 Radiculopathy, cervical region: Secondary | ICD-10-CM | POA: Diagnosis not present

## 2023-07-02 DIAGNOSIS — R110 Nausea: Secondary | ICD-10-CM | POA: Diagnosis not present

## 2023-07-02 DIAGNOSIS — R519 Headache, unspecified: Secondary | ICD-10-CM | POA: Diagnosis not present

## 2023-07-02 DIAGNOSIS — J069 Acute upper respiratory infection, unspecified: Secondary | ICD-10-CM | POA: Diagnosis not present

## 2023-07-02 DIAGNOSIS — R070 Pain in throat: Secondary | ICD-10-CM | POA: Diagnosis not present

## 2023-07-21 DIAGNOSIS — M5412 Radiculopathy, cervical region: Secondary | ICD-10-CM | POA: Diagnosis not present

## 2023-07-24 DIAGNOSIS — G5603 Carpal tunnel syndrome, bilateral upper limbs: Secondary | ICD-10-CM | POA: Diagnosis not present

## 2023-07-25 DIAGNOSIS — M5412 Radiculopathy, cervical region: Secondary | ICD-10-CM | POA: Diagnosis not present

## 2023-08-01 DIAGNOSIS — G5603 Carpal tunnel syndrome, bilateral upper limbs: Secondary | ICD-10-CM | POA: Diagnosis not present

## 2023-08-04 ENCOUNTER — Telehealth: Payer: Self-pay

## 2023-08-04 NOTE — Telephone Encounter (Signed)
Pharmacy please advise on holding Eliquis prior to Right Carpel Tunnel Release scheduled for TBD. Thank you.

## 2023-08-04 NOTE — Telephone Encounter (Signed)
   Pre-operative Risk Assessment    Patient Name: Mary Sharp  DOB: [birth year] MRN: [id number]   Last OV 06/24/22 with Robin Searing, NP Next OV None   Request for Surgical Clearance    Procedure:   Right Carpel Tunnel Syndrome  Date of Surgery:  Clearance TBD                                 Surgeon:  Waylan Rocher, MD Surgeon's Group or Practice Name:  [facility] Phone number:  [phone] Fax number:  [phone]   Type of Clearance Requested:   - Medical  - Pharmacy:  Hold Apixaban (Eliquis) pt will need instructions on when/if to hold   Type of Anesthesia:  Not Indicated   Additional requests/questions:    Wynetta Fines   08/04/2023, 1:17 PM

## 2023-08-05 NOTE — Telephone Encounter (Addendum)
   Name:  Mary Sharp  DOB:  18-Oct-1961  MRN:  [id number]   Primary Cardiologist: Olga Millers, MD  Chart reviewed as part of pre-operative protocol coverage. Patient was contacted 08/05/2023 in reference to pre-operative risk assessment for pending surgery as outlined below.  Mary Sharp was last seen on 06/24/2022 by Robin Searing.  Since that day, Mary Sharp has done well.   Patient with diagnosis of PE and DVT in 2021 on Eliquis for anticoagulation. Single episode of afib in 2010 with no recurrence. Remains on anticoag given prior VTE. We are not prescribing her Eliquis and she has not had labs in > 1 year. Recommend clearance come from managing provider. We have Dr. Smith Mince as PCP  She will need an appointment with cardiology for medical clearance. We will make that for her.    Joni Reining, NP 08/05/2023, 11:08 AM

## 2023-08-05 NOTE — Telephone Encounter (Signed)
Pt has been scheduled for in office appt with Azalee Course, PAC @ 10:30. I will update all parties involved.

## 2023-08-05 NOTE — Telephone Encounter (Signed)
Patient with diagnosis of PE and DVT in 2021 on Eliquis for anticoagulation. Single episode of afib in 2010 with no recurrence. Remains on anticoag given prior VTE. We are not prescribing her Eliquis and she has not had labs in > 1 year. Recommend clearance come from managing provider.

## 2023-08-05 NOTE — Telephone Encounter (Signed)
   Name: Mary Sharp  DOB: [birth year]  MRN: [id number]  Primary Cardiologist: Olga Millers, MD  Chart reviewed as part of pre-operative protocol coverage. Because of Ellasandra Asch Zelman's past medical history and time since last visit, she will require a follow-up in-office visit in order to better assess preoperative cardiovascular risk.  Pre-op covering staff: - Please schedule appointment and call patient to inform them. If patient already had an upcoming appointment within acceptable timeframe, please add "pre-op clearance" to the appointment notes so provider is aware. - Please contact requesting surgeon's office via preferred method (i.e, phone, fax) to inform them of need for appointment prior to surgery.  Holding Eliquis recommendations will come from PCP. Per pharmacy Patient with diagnosis of PE and DVT in 2021 on Eliquis for anticoagulation. Single episode of afib in 2010 with no recurrence. Remains on anticoag given prior VTE. We are not prescribing her Eliquis and she has not had labs in > 1 year. Recommend clearance come from managing provider.    Joni Reining, NP  08/05/2023, 11:17 AM

## 2023-08-12 ENCOUNTER — Ambulatory Visit: Payer: BC Managed Care – PPO | Attending: Physician Assistant | Admitting: Physician Assistant

## 2023-08-12 VITALS — BP 157/78 | HR 60 | Ht 63.0 in | Wt 281.4 lb

## 2023-08-12 DIAGNOSIS — E785 Hyperlipidemia, unspecified: Secondary | ICD-10-CM

## 2023-08-12 DIAGNOSIS — Z794 Long term (current) use of insulin: Secondary | ICD-10-CM

## 2023-08-12 DIAGNOSIS — I10 Essential (primary) hypertension: Secondary | ICD-10-CM | POA: Diagnosis not present

## 2023-08-12 DIAGNOSIS — Z01810 Encounter for preprocedural cardiovascular examination: Secondary | ICD-10-CM | POA: Diagnosis not present

## 2023-08-12 DIAGNOSIS — E119 Type 2 diabetes mellitus without complications: Secondary | ICD-10-CM

## 2023-08-12 DIAGNOSIS — I480 Paroxysmal atrial fibrillation: Secondary | ICD-10-CM

## 2023-08-12 NOTE — Patient Instructions (Signed)
Medication Instructions:  NO CHANGES *If you need a refill on your cardiac medications before your next appointment, please call your pharmacy*   Lab Work: NO LABS If you have labs (blood work) drawn today and your tests are completely normal, you will receive your results only by: MyChart Message (if you have MyChart) OR A paper copy in the mail If you have any lab test that is abnormal or we need to change your treatment, we will call you to review the results.   Testing/Procedures: NO TESTING   Follow-Up: At [facility], you and your health needs are our priority.  As part of our continuing mission to provide you with exceptional heart care, we have created designated Provider Care Teams.  These Care Teams include your primary Cardiologist (physician) and Advanced Practice Providers (APPs -  Physician Assistants and Nurse Practitioners) who all work together to provide you with the care you need, when you need it.   Your next appointment:   1 year(s)  Provider:   Olga Millers, MD

## 2023-08-12 NOTE — Progress Notes (Unsigned)
Cardiology Office Note:  .   Date:  08/13/2023  ID:  Mary Sharp, DOB Mar 05, 1961, MRN [id number] PCP: Chilton Greathouse, MD  [facility] Providers Cardiologist:  Olga Millers, MD     History of Present Illness: .   Mary Sharp is a 62 y.o. female with PMH of HTN, HLD, DM II, isolated PAF in October 2010.  Myoview in February 2015 showed EF 51%, normal perfusion.  Echocardiogram in February 2021 showed normal EF, mild LVH, grade 1 DD.  Venous Doppler in February 2021 showed DVT on the right.  CTA in February 2021 showed bilateral PE.  She has been on Eliquis long-term given history of obesity and immobility.  Patient was last seen by Dr. Jens Som in March 2023 at which time she was doing well.  She was seen by Robin Searing NP in July 2023 for preop clearance prior to colonoscopy.  She presents today for preoperative clearance prior to right carpal tunnel surgery.  She denies any recent chest pain.  She is able to walk 30 minutes in the grocery store without any exertional symptoms.  She does not do any heavy exercise.  Patient has no prior history of CAD.  Her RCRI perioperative risk is 6%.  Overall, she is at acceptable risk to proceed from a cardiac perspective.  EKG shows no significant ischemic changes.  Her PCP manages the Eliquis, will defer to PCP to decide on the holding time of Eliquis prior to surgery.  ROS:   She denies chest pain, palpitations, dyspnea, pnd, orthopnea, n, v, dizziness, syncope, edema, weight gain, or early satiety. All other systems reviewed and are otherwise negative except as noted above.    Studies Reviewed: Marland Kitchen   EKG Interpretation Date/Time:  Tuesday August 12 2023 11:41:27 EDT Ventricular Rate:  60 PR Interval:  202 QRS Duration:  88 QT Interval:  424 QTC Calculation: 424 R Axis:   -15  Text Interpretation: Confirmed by Azalee Course [phone]) on 08/12/2023 11:45:14 AM    Cardiac Studies & Procedures       ECHOCARDIOGRAM  ECHOCARDIOGRAM  COMPLETE 12/28/2019  Narrative ECHOCARDIOGRAM REPORT    Patient Name:   Mary Sharp Date of Exam: 12/28/2019 Medical Rec #:  [id number]     Height:       64.0 in Accession #:    [id number]    Weight:       283.7 lb Date of Birth:  Apr 09, 1961     BSA:          2.27 m Patient Age:    58 years      BP:           162/74 mmHg Patient Gender: F             HR:           73 bpm. Exam Location:  Inpatient  Procedure: 2D Echo  Indications:    Pulmonary Embolus I26.99  History:        Patient has no prior history of Echocardiogram examinations. Arrythmias:Atrial Fibrillation; Risk Factors:Hypertension, Diabetes, Dyslipidemia and Former Smoker.  Sonographer:    Thurman Coyer RDCS (AE) Referring Phys: [phone] JARED M GARDNER  IMPRESSIONS   1. Left ventricular ejection fraction, by visual estimation, is 60 to 65%. The left ventricle has normal function. Left ventricular septal wall thickness was mildly increased. Mildly increased left ventricular posterior wall thickness. 2. The left ventricle has no regional wall motion abnormalities. 3. Left ventricular diastolic parameters are  consistent with Grade I diastolic dysfunction (impaired relaxation). 4. Global right ventricle has normal systolic function.The right ventricular size is normal. No increase in right ventricular wall thickness. 5. Left atrial size was normal. 6. Right atrial size was normal. 7. The mitral valve is normal in structure. No evidence of mitral valve regurgitation. No evidence of mitral stenosis. 8. The tricuspid valve is normal in structure. Tricuspid valve regurgitation is not demonstrated. 9. The aortic valve is normal in structure. Aortic valve regurgitation is not visualized. No evidence of aortic valve sclerosis or stenosis. 10. The pulmonic valve was normal in structure. Pulmonic valve regurgitation is not visualized. 11. The inferior vena cava is dilated in size with >50% respiratory variability, suggesting right  atrial pressure of 8 mmHg. 12. TR signal is inadequate for assessing pulmonary artery systolic pressure.  FINDINGS Left Ventricle: Left ventricular ejection fraction, by visual estimation, is 60 to 65%. The left ventricle has normal function. The left ventricle has no regional wall motion abnormalities. Mildly increased left ventricular posterior wall thickness. Left ventricular diastolic parameters are consistent with Grade I diastolic dysfunction (impaired relaxation).  Right Ventricle: The right ventricular size is normal. No increase in right ventricular wall thickness. Global RV systolic function is has normal systolic function.  Left Atrium: Left atrial size was normal in size.  Right Atrium: Right atrial size was normal in size  Pericardium: There is no evidence of pericardial effusion.  Mitral Valve: The mitral valve is normal in structure. No evidence of mitral valve regurgitation. No evidence of mitral valve stenosis by observation.  Tricuspid Valve: The tricuspid valve is normal in structure. Tricuspid valve regurgitation is not demonstrated.  Aortic Valve: The aortic valve is normal in structure. Aortic valve regurgitation is not visualized. The aortic valve is structurally normal, with no evidence of sclerosis or stenosis.  Pulmonic Valve: The pulmonic valve was normal in structure. Pulmonic valve regurgitation is not visualized. Pulmonic regurgitation is not visualized.  Aorta: The aortic root, ascending aorta and aortic arch are all structurally normal, with no evidence of dilitation or obstruction.  Venous: The inferior vena cava is dilated in size with greater than 50% respiratory variability, suggesting right atrial pressure of 8 mmHg.  IAS/Shunts: No atrial level shunt detected by color flow Doppler. There is no evidence of a patent foramen ovale. No ventricular septal defect is seen or detected. There is no evidence of an atrial septal defect.   LEFT VENTRICLE PLAX  2D LVIDd:         4.60 cm  Diastology LVIDs:         3.00 cm  LV e' lateral:   7.51 cm/s LV PW:         1.10 cm  LV E/e' lateral: 10.6 LV IVS:        1.20 cm  LV e' medial:    7.72 cm/s LVOT diam:     2.40 cm  LV E/e' medial:  10.3 LV SV:         62 ml LV SV Index:   25.05 LVOT Area:     4.52 cm   RIGHT VENTRICLE TAPSE (M-mode): 2.5 cm  LEFT ATRIUM             Index       RIGHT ATRIUM           Index LA diam:        3.40 cm 1.50 cm/m  RA Area:     11.60 cm LA Vol (A2C):  40.9 ml 18.02 ml/m RA Volume:   21.10 ml  9.30 ml/m LA Vol (A4C):   47.0 ml 20.71 ml/m LA Biplane Vol: 44.3 ml 19.52 ml/m AORTIC VALVE LVOT Vmax:   115.00 cm/s LVOT Vmean:  73.500 cm/s LVOT VTI:    0.252 m  AORTA Ao Root diam: 3.30 cm  MITRAL VALVE MV Area (PHT): 3.53 cm             SHUNTS MV PHT:        62.35 msec           Systemic VTI:  0.25 m MV Decel Time: 215 msec             Systemic Diam: 2.40 cm MV E velocity: 79.30 cm/s 103 cm/s MV A velocity: 83.60 cm/s 70.3 cm/s MV E/A ratio:  0.95       1.5   Armanda Magic MD Electronically signed by Armanda Magic MD Signature Date/Time: 12/28/2019/1:40:19 PM    Final             Risk Assessment/Calculations:    CHA2DS2-VASc Score = 3   This indicates a 3.2% annual risk of stroke. The patient's score is based upon: CHF History: 0 HTN History: 1 Diabetes History: 1 Stroke History: 0 Vascular Disease History: 0 Age Score: 0 Gender Score: 1           Physical Exam:   VS:  BP (!) 157/78 (BP Location: Left Arm, Patient Position: Sitting, Cuff Size: Large)   Pulse 60   Ht 5\' 3"  (1.6 m)   Wt 281 lb 6.4 oz (127.6 kg)   SpO2 96%   BMI 49.85 kg/m    Wt Readings from Last 3 Encounters:  08/12/23 281 lb 6.4 oz (127.6 kg)  04/01/22 280 lb (127 kg)  02/05/22 281 lb 3.2 oz (127.6 kg)    GEN: Well nourished, well developed in no acute distress NECK: No JVD; No carotid bruits CARDIAC: RRR, no murmurs, rubs, gallops RESPIRATORY:  Clear to  auscultation without rales, wheezing or rhonchi  ABDOMEN: Soft, non-tender, non-distended EXTREMITIES:  No edema; No deformity   ASSESSMENT AND PLAN: .    Preoperative clearance: Pending right carpal tunnel surgery.  Patient denies any chest pain.  She is able to walk 30 minutes in the supermarket without any exertional issue.  Her RCRI perioperative risk is class II, 6% risk of major cardiac event.  She does not have a prior history of CAD.  EKG showed no ischemic changes.  She may proceed from the cardiac perspective without further workup.  PAF: On Eliquis, managed by Dr. Felipa Eth, her PCP.  Will defer to her PCP to decide on the holding time of Eliquis prior to the upcoming surgery.  Hypertension: Blood pressure elevated today, however fairly controlled at home  Hyperlipidemia: On pravastatin  DM2: Managed by primary care provider: On insulin       Dispo: Follow-up with Dr. Jens Som in 1 year  Signed, Azalee Course, [place]

## 2023-08-13 DIAGNOSIS — H04123 Dry eye syndrome of bilateral lacrimal glands: Secondary | ICD-10-CM | POA: Diagnosis not present

## 2023-08-13 DIAGNOSIS — H40013 Open angle with borderline findings, low risk, bilateral: Secondary | ICD-10-CM | POA: Diagnosis not present

## 2023-08-13 DIAGNOSIS — E119 Type 2 diabetes mellitus without complications: Secondary | ICD-10-CM | POA: Diagnosis not present

## 2023-08-13 DIAGNOSIS — H25813 Combined forms of age-related cataract, bilateral: Secondary | ICD-10-CM | POA: Diagnosis not present

## 2023-08-13 DIAGNOSIS — H524 Presbyopia: Secondary | ICD-10-CM | POA: Diagnosis not present

## 2023-08-13 NOTE — Telephone Encounter (Signed)
Patient Name: Mary Sharp  DOB: 05/27/1961 MRN: [id number]  Primary Cardiologist: Olga Millers, MD  Chart reviewed as part of pre-operative protocol coverage. Given past medical history and time since last visit, based on ACC/AHA guidelines, Mary Sharp is at acceptable risk for the planned procedure without further cardiovascular testing.   The patient was advised that if she develops new symptoms prior to surgery to contact our office to arrange for a follow-up visit, and she verbalized understanding.  Will defer to PCP who manage the Eliquis regarding the holding time of Eliquis during perioperative period.   I will route this recommendation to the requesting party via Epic fax function and remove from pre-op pool.  Please call with questions.  [place], [place] 08/13/2023, 9:21 AM

## 2023-08-20 NOTE — Patient Instructions (Signed)

## 2023-08-21 ENCOUNTER — Encounter: Payer: Self-pay | Admitting: *Deleted

## 2023-08-21 ENCOUNTER — Telehealth: Payer: Self-pay

## 2023-08-21 NOTE — Telephone Encounter (Signed)
Please see mychart message from today.

## 2023-08-25 ENCOUNTER — Encounter: Payer: Self-pay | Admitting: Family Medicine

## 2023-08-25 ENCOUNTER — Telehealth (INDEPENDENT_AMBULATORY_CARE_PROVIDER_SITE_OTHER): Payer: BC Managed Care – PPO | Admitting: Family Medicine

## 2023-08-25 DIAGNOSIS — G4730 Sleep apnea, unspecified: Secondary | ICD-10-CM | POA: Diagnosis not present

## 2023-08-28 DIAGNOSIS — G5601 Carpal tunnel syndrome, right upper limb: Secondary | ICD-10-CM | POA: Diagnosis not present

## 2023-09-09 DIAGNOSIS — R509 Fever, unspecified: Secondary | ICD-10-CM | POA: Diagnosis not present

## 2023-09-09 DIAGNOSIS — R519 Headache, unspecified: Secondary | ICD-10-CM | POA: Diagnosis not present

## 2023-09-09 DIAGNOSIS — R0981 Nasal congestion: Secondary | ICD-10-CM | POA: Diagnosis not present

## 2023-09-09 DIAGNOSIS — R051 Acute cough: Secondary | ICD-10-CM | POA: Diagnosis not present

## 2023-09-17 DIAGNOSIS — G4733 Obstructive sleep apnea (adult) (pediatric): Secondary | ICD-10-CM | POA: Diagnosis not present

## 2023-10-09 DIAGNOSIS — G5602 Carpal tunnel syndrome, left upper limb: Secondary | ICD-10-CM | POA: Diagnosis not present

## 2023-12-04 IMAGING — MG MM DIGITAL SCREENING BILAT W/ TOMO AND CAD
6 of 12 series · 6 of 36 positions shown · non-contrast
Comparison: Previous exams.

CLINICAL DATA: Screening.

EXAM:
DIGITAL SCREENING BILATERAL MAMMOGRAM WITH TOMOSYNTHESIS AND CAD
TECHNIQUE: Bilateral screening digital craniocaudal and mediolateral oblique
mammograms were obtained. Bilateral screening digital breast
tomosynthesis was performed. The images were evaluated with
computer-aided detection.

[L CC synth-2D (1 of 2)]
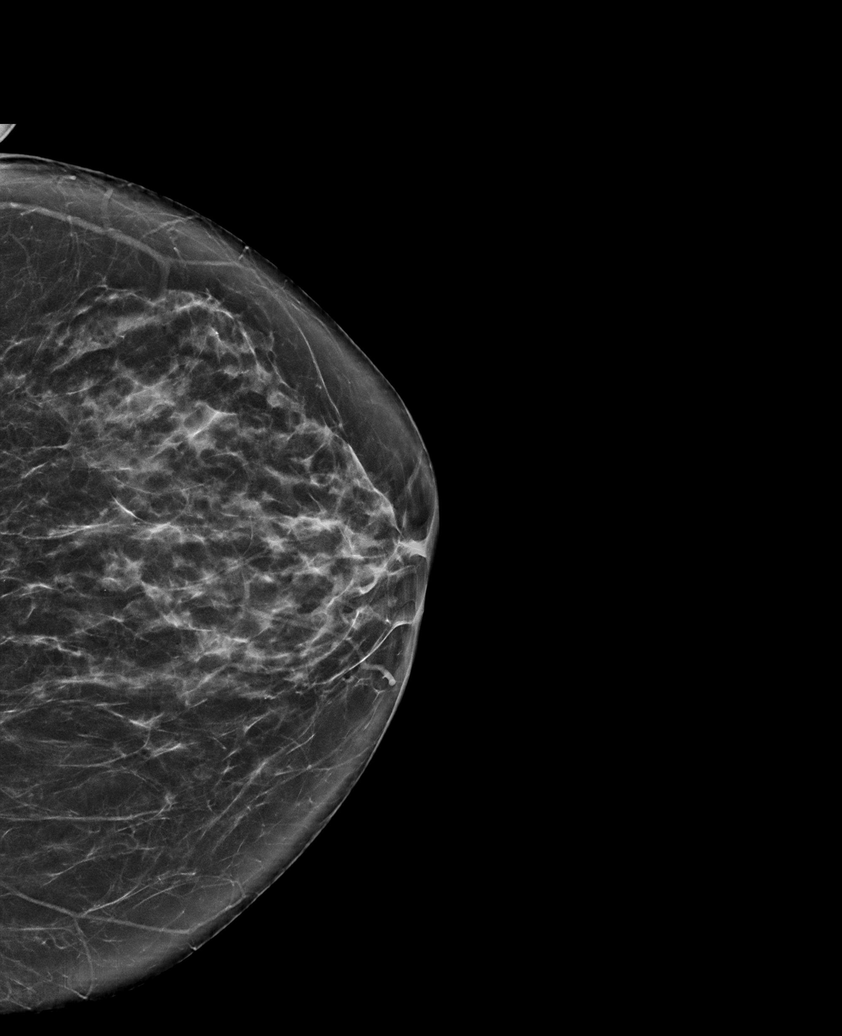

[R MLO synth-2D]
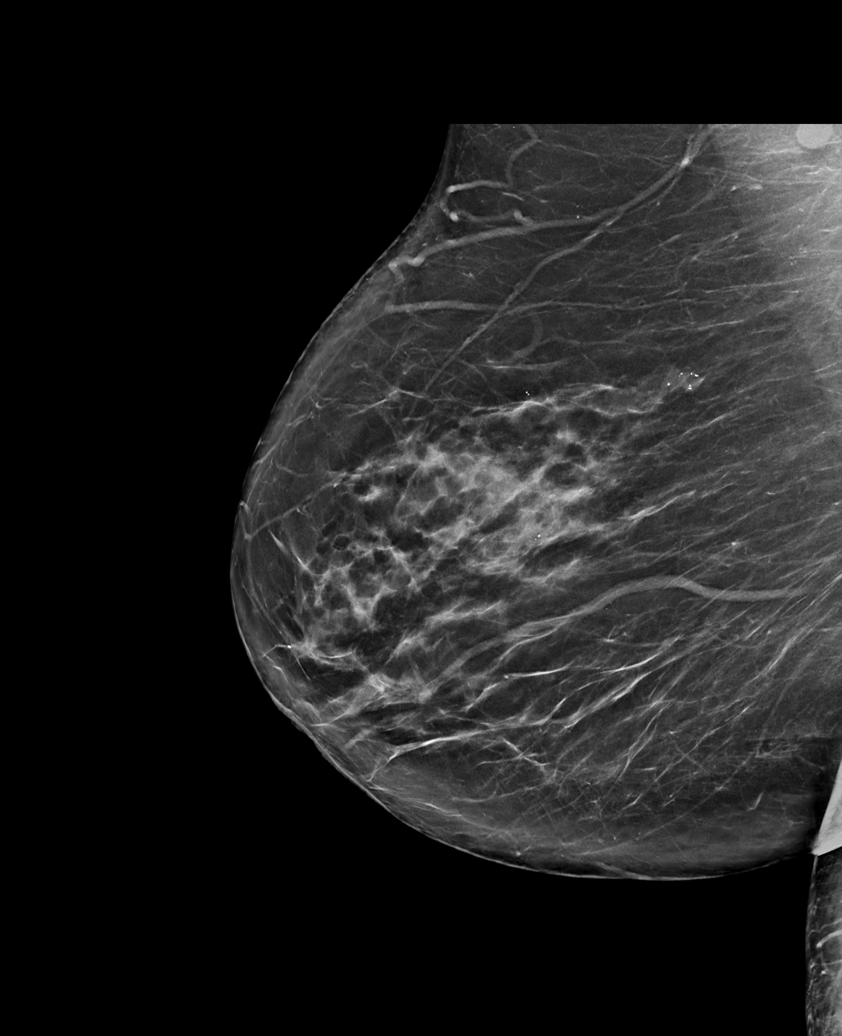

[L CC synth-2D (2 of 2)]
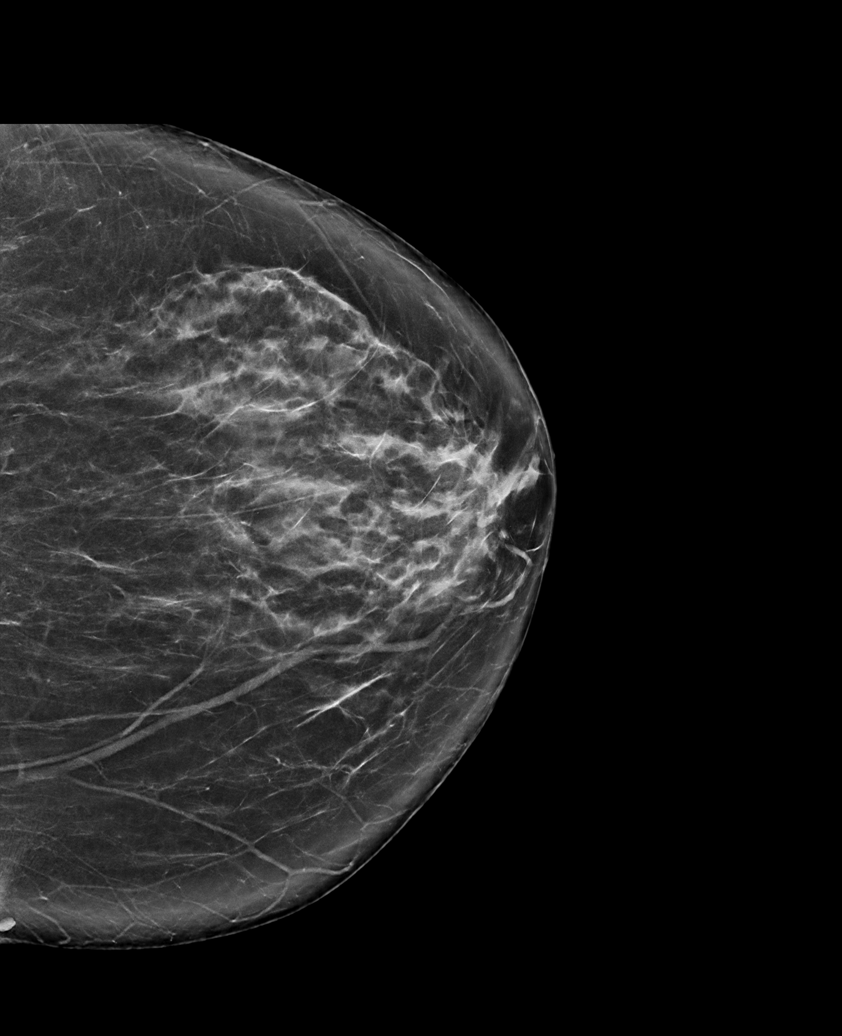

[R XCCL synth-2D]
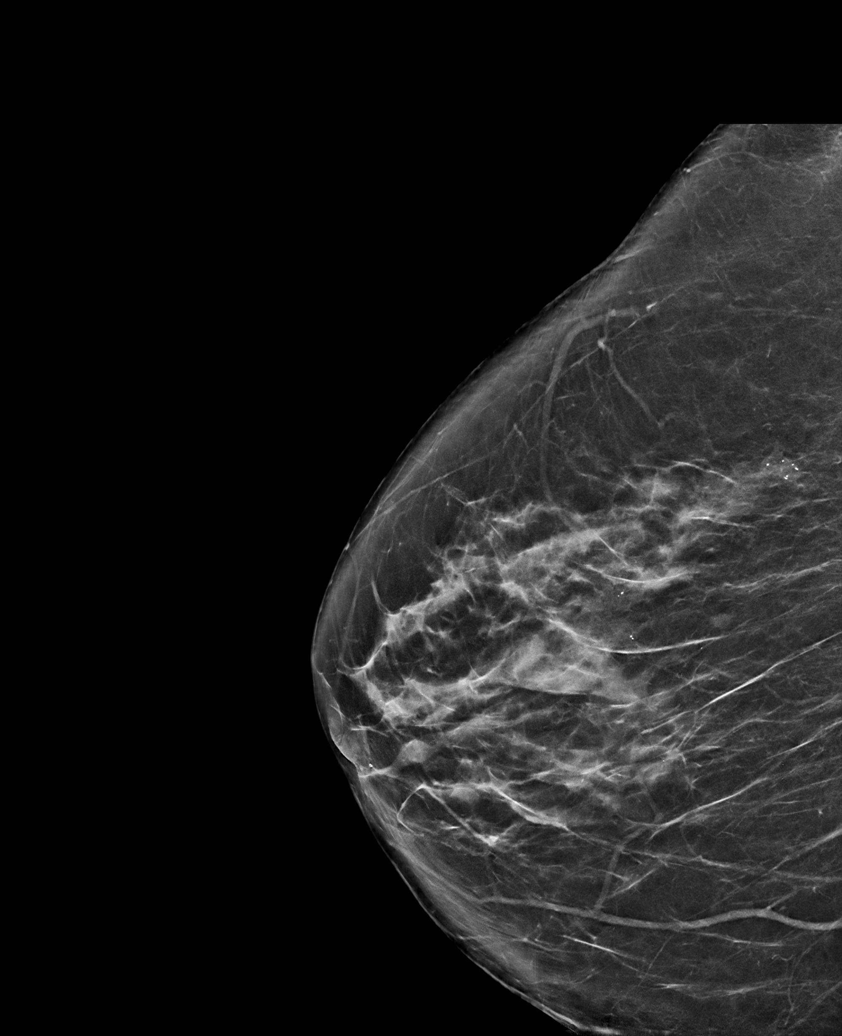

[L MLO synth-2D]
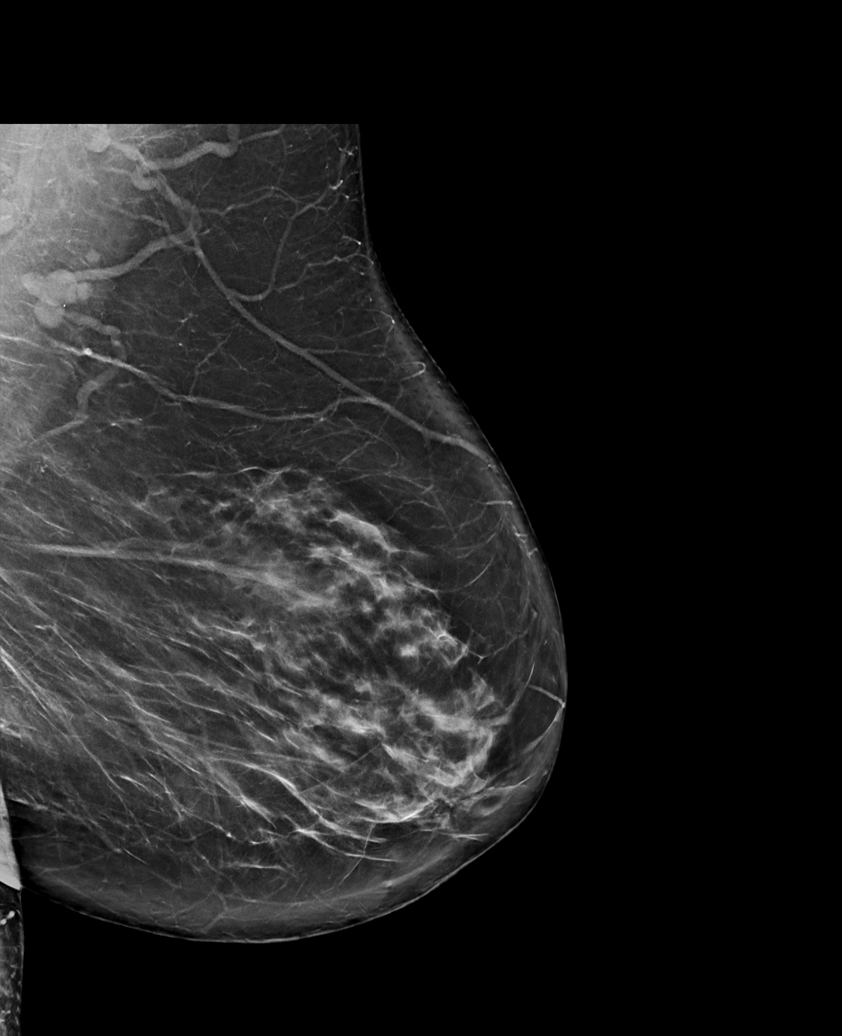

[R CC synth-2D]
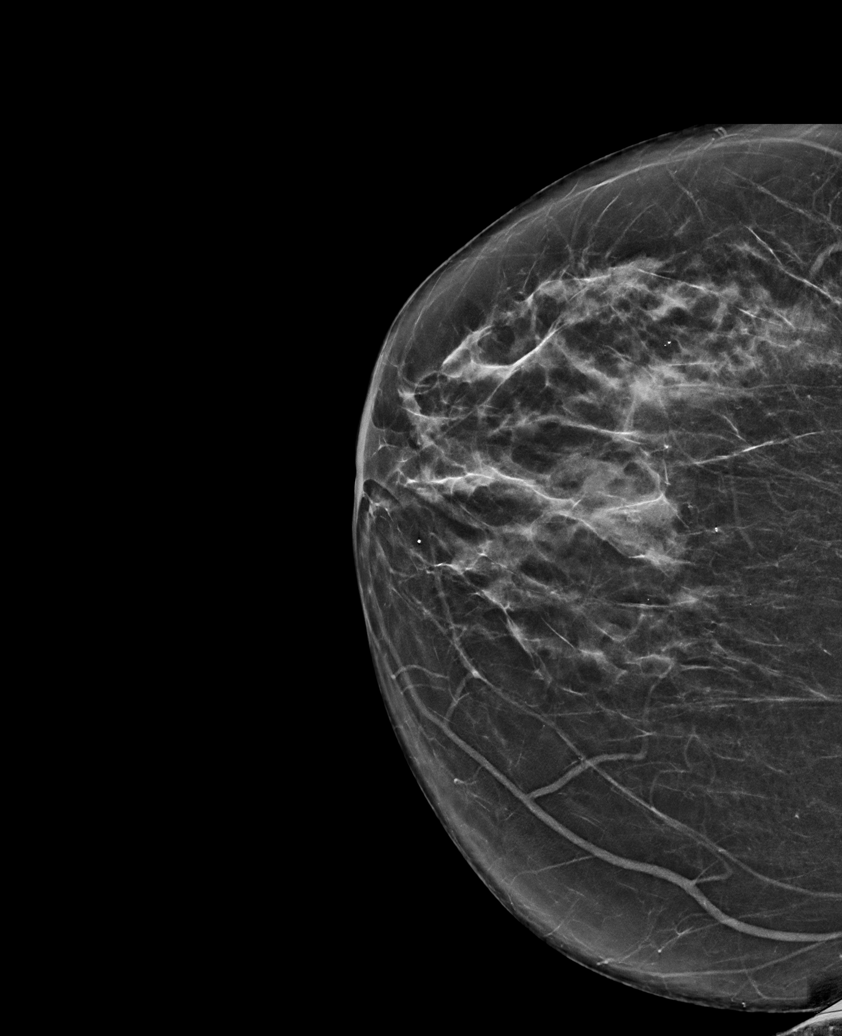

[6 of 36 positions shown; findings below may reference images not displayed]

ACR Breast Density Category c: The breast tissue is heterogeneously
dense, which may obscure small masses.
FINDINGS: In the right breast, calcifications warrant further evaluation with
magnified views. In the left breast, no findings suspicious for
malignancy.
IMPRESSION: Further evaluation is suggested for calcifications in the right
breast.

RECOMMENDATION:
Diagnostic mammogram of the right breast. (Code:F2-2-QQ3)

The patient will be contacted regarding the findings, and additional
imaging will be scheduled.

BI-RADS CATEGORY  0: Incomplete. Need additional imaging evaluation
and/or prior mammograms for comparison.

## 2024-01-19 DIAGNOSIS — M5416 Radiculopathy, lumbar region: Secondary | ICD-10-CM | POA: Diagnosis not present

## 2024-01-28 DIAGNOSIS — Z1339 Encounter for screening examination for other mental health and behavioral disorders: Secondary | ICD-10-CM | POA: Diagnosis not present

## 2024-01-28 DIAGNOSIS — Z0000 Encounter for general adult medical examination without abnormal findings: Secondary | ICD-10-CM | POA: Diagnosis not present

## 2024-01-28 DIAGNOSIS — Z1331 Encounter for screening for depression: Secondary | ICD-10-CM | POA: Diagnosis not present

## 2024-02-03 DIAGNOSIS — M5416 Radiculopathy, lumbar region: Secondary | ICD-10-CM | POA: Diagnosis not present

## 2024-02-10 DIAGNOSIS — E0590 Thyrotoxicosis, unspecified without thyrotoxic crisis or storm: Secondary | ICD-10-CM | POA: Diagnosis not present

## 2024-02-11 DIAGNOSIS — M8580 Other specified disorders of bone density and structure, unspecified site: Secondary | ICD-10-CM | POA: Diagnosis not present

## 2024-02-11 DIAGNOSIS — E1165 Type 2 diabetes mellitus with hyperglycemia: Secondary | ICD-10-CM | POA: Diagnosis not present

## 2024-02-11 DIAGNOSIS — E785 Hyperlipidemia, unspecified: Secondary | ICD-10-CM | POA: Diagnosis not present

## 2024-02-17 DIAGNOSIS — I480 Paroxysmal atrial fibrillation: Secondary | ICD-10-CM | POA: Diagnosis not present

## 2024-02-17 DIAGNOSIS — R82998 Other abnormal findings in urine: Secondary | ICD-10-CM | POA: Diagnosis not present

## 2024-02-17 DIAGNOSIS — I10 Essential (primary) hypertension: Secondary | ICD-10-CM | POA: Diagnosis not present

## 2024-04-02 DIAGNOSIS — R509 Fever, unspecified: Secondary | ICD-10-CM | POA: Diagnosis not present

## 2024-04-02 DIAGNOSIS — R0981 Nasal congestion: Secondary | ICD-10-CM | POA: Diagnosis not present

## 2024-04-02 DIAGNOSIS — R062 Wheezing: Secondary | ICD-10-CM | POA: Diagnosis not present

## 2024-04-02 DIAGNOSIS — R051 Acute cough: Secondary | ICD-10-CM | POA: Diagnosis not present

## 2024-04-28 DIAGNOSIS — G4733 Obstructive sleep apnea (adult) (pediatric): Secondary | ICD-10-CM | POA: Diagnosis not present

## 2024-05-03 ENCOUNTER — Other Ambulatory Visit: Payer: Self-pay

## 2024-05-03 ENCOUNTER — Emergency Department (HOSPITAL_BASED_OUTPATIENT_CLINIC_OR_DEPARTMENT_OTHER)
Admission: EM | Admit: 2024-05-03 | Discharge: 2024-05-03 | Disposition: A | Discharge status: Home / Self Care | Attending: Emergency Medicine | Admitting: Emergency Medicine

## 2024-05-03 ENCOUNTER — Encounter (HOSPITAL_BASED_OUTPATIENT_CLINIC_OR_DEPARTMENT_OTHER): Payer: Self-pay | Admitting: Emergency Medicine

## 2024-05-03 DIAGNOSIS — E119 Type 2 diabetes mellitus without complications: Secondary | ICD-10-CM | POA: Diagnosis not present

## 2024-05-03 DIAGNOSIS — Z7901 Long term (current) use of anticoagulants: Secondary | ICD-10-CM | POA: Insufficient documentation

## 2024-05-03 DIAGNOSIS — Z87891 Personal history of nicotine dependence: Secondary | ICD-10-CM | POA: Insufficient documentation

## 2024-05-03 DIAGNOSIS — I10 Essential (primary) hypertension: Secondary | ICD-10-CM | POA: Insufficient documentation

## 2024-05-03 DIAGNOSIS — R002 Palpitations: Secondary | ICD-10-CM | POA: Diagnosis not present

## 2024-05-03 DIAGNOSIS — Z79899 Other long term (current) drug therapy: Secondary | ICD-10-CM | POA: Diagnosis not present

## 2024-05-03 DIAGNOSIS — R600 Localized edema: Secondary | ICD-10-CM | POA: Diagnosis not present

## 2024-05-03 LAB — CBC
HCT: 42.2 % (ref 36.0–46.0)
Hemoglobin: 13.2 g/dL (ref 12.0–15.0)
MCH: 24.8 pg — ABNORMAL LOW (ref 26.0–34.0)
MCHC: 31.3 g/dL (ref 30.0–36.0)
MCV: 79.3 fL — ABNORMAL LOW (ref 80.0–100.0)
Platelets: 351 10*3/uL (ref 150–400)
RBC: 5.32 MIL/uL — ABNORMAL HIGH (ref 3.87–5.11)
RDW: 15.6 % — ABNORMAL HIGH (ref 11.5–15.5)
WBC: 9.2 10*3/uL (ref 4.0–10.5)
nRBC: 0 % (ref 0.0–0.2)

## 2024-05-03 LAB — BASIC METABOLIC PANEL WITH GFR
Anion gap: 14 (ref 5–15)
BUN: 18 mg/dL (ref 8–23)
CO2: 24 mmol/L (ref 22–32)
Calcium: 9.8 mg/dL (ref 8.9–10.3)
Chloride: 99 mmol/L (ref 98–111)
Creatinine, Ser: 0.75 mg/dL (ref 0.44–1.00)
GFR, Estimated: >60 mL/min (ref >60)
Glucose, Bld: 168 mg/dL — ABNORMAL HIGH (ref 70–99)
Potassium: 4.1 mmol/L (ref 3.5–5.1)
Sodium: 138 mmol/L (ref 135–145)

## 2024-05-03 LAB — D-DIMER, QUANTITATIVE: D-Dimer, Quant: 0.28 ug{FEU}/mL (ref 0.00–0.50)

## 2024-05-03 LAB — TROPONIN T, HIGH SENSITIVITY: Troponin T High Sensitivity: <15 ng/L (ref <19)

## 2024-05-03 NOTE — Discharge Instructions (Addendum)
 Return to the emergency department if your symptoms worsen or if you experience chest pain, shortness of breath, dizziness/lightheadedness.  Follow-up with your primary care provider or your cardiologist for further evaluation of your symptoms.

## 2024-05-03 NOTE — ED Triage Notes (Addendum)
 Palpitation today and shortness of breath today . Hx A fib . High BP reading today . Alert and oriented x 4  Hx bilateral PE , on Eliquis 

## 2024-05-03 NOTE — ED Notes (Signed)
 Reviewed D/C information with the patient, pt verbalized understanding. No additional concerns at this time.

## 2024-05-03 NOTE — ED Provider Notes (Signed)
 [facility] Provider Note   CSN: [id number] Arrival date & time: 05/03/24  1754     History  Chief Complaint  Patient presents with   Palpitations    Mary Sharp is a 63 y.o. female.  63 year old female presenting with palpitations.  She was leaving work walking to her car when she felt fluttering sensation in her chest, history of A-fib as well as bilateral PEs that were diagnosed in 2021.  Patient is on Eliquis .  She reportedly felt symptomatic with palpitations and shortness of breath while walking, no shortness of breath at rest.  Reports mild nausea at this time but declines antiemetics.  Denies chest pain, vomiting/abdominal pain/diarrhea.  She does endorse bilateral lower extremity edema but reports that this is chronic in nature and unchanged from her baseline.   Palpitations      Home Medications Prior to Admission medications   Medication Sig Start Date End Date Taking? Authorizing Provider  amLODipine  (NORVASC ) 10 MG tablet Take 10 mg by mouth every evening.    [provider]  apixaban  (ELIQUIS ) 5 MG TABS tablet Take 5 mg by mouth 2 (two) times daily.    [provider]  BD PEN NEEDLE NANO U/F 32G X 4 MM MISC USE ONCE DAILY FOR TRESIBA INJECTION (DX: ICD10: E11.65) for 90 days 07/15/23   [provider]  cholecalciferol  (VITAMIN D3) 25 MCG (1000 UNIT) tablet Take 2,000 Units by mouth daily.    [provider]  colesevelam  (WELCHOL ) 625 MG tablet Take 625 mg by mouth 2 (two) times daily with a meal.    [provider]  Empagliflozin-metFORMIN HCl 12.03-999 MG TABS Take 1 tablet by mouth 2 (two) times daily.    [provider]  Eyelid Cleansers (OCUSOFT EYELID CLEANSING) PADS Place 1 application into both eyes every morning.    [provider]  fexofenadine (ALLEGRA) 180 MG tablet Take 180 mg by mouth every morning.    [provider]  glucose blood  (CONTOUR NEXT TEST) test strip  05/12/22   [provider]  insulin  degludec (TRESIBA FLEXTOUCH) 100 UNIT/ML SOPN FlexTouch Pen Inject 75 Units into the skin every morning.    [provider]  metoprolol  (LOPRESSOR ) 50 MG tablet Take 50 mg by mouth 2 (two) times daily.    [provider]  Multiple Vitamin (MULTIVITAMIN) tablet Take 1 tablet by mouth daily.    [provider]  Multiple Vitamins-Minerals (AIRBORNE) CHEW Chew 1 tablet by mouth daily.    [provider]  NEURONTIN 300 MG capsule Take 300 mg by mouth as needed. Patient not taking: Reported on 08/12/2023 06/11/22   [provider]  pantoprazole  (PROTONIX ) 40 MG tablet Take 40 mg by mouth daily. 01/05/20   [provider]  pravastatin  (PRAVACHOL ) 40 MG tablet Take 40 mg by mouth every morning.     [provider]  telmisartan (MICARDIS) 80 MG tablet Take 80 mg by mouth daily. 11/24/19   [provider]  Ubiquinol 100 MG CAPS Take 100 mg by mouth every morning.    [provider]      Allergies    Codeine, Doxycycline, Hydrocodone-acetaminophen , Propoxyphene n-acetaminophen , and Morphine    Review of Systems   Review of Systems  Cardiovascular:  Positive for palpitations.    Physical Exam Updated Vital Signs BP (!) 176/89 (BP Location: Right Wrist)   Pulse 78   Temp 98 F (36.7 C) (Oral)  Resp 20   Wt 124.7 kg   SpO2 96%   BMI 48.71 kg/m  Physical Exam Vitals and nursing note reviewed.  HENT:     Head: Normocephalic.  Eyes:     Extraocular Movements: Extraocular movements intact.     Pupils: Pupils are equal, round, and reactive to light.  Cardiovascular:     Rate and Rhythm: Normal rate and regular rhythm.     Pulses:          Radial pulses are 2+ on the right side and 2+ on the left side.     Heart sounds: Normal heart sounds.  Pulmonary:     Effort: Pulmonary effort is normal.     Breath sounds: Normal breath sounds.   Abdominal:     Palpations: Abdomen is soft.     Tenderness: There is no abdominal tenderness.  Musculoskeletal:     Cervical back: Normal range of motion.     Right lower leg: Edema (nonpitting) present.     Left lower leg: Edema (nonpitting) present.  Neurological:     General: No focal deficit present.     Mental Status: She is alert.     ED Results / Procedures / Treatments   Labs (all labs ordered are listed, but only abnormal results are displayed) Labs Reviewed  BASIC METABOLIC PANEL WITH GFR - Abnormal; Notable for the following components:      Result Value   Glucose, Bld 168 (*)    All other components within normal limits  CBC - Abnormal; Notable for the following components:   RBC 5.32 (*)    MCV 79.3 (*)    MCH 24.8 (*)    RDW 15.6 (*)    All other components within normal limits  D-DIMER, QUANTITATIVE  TROPONIN T, HIGH SENSITIVITY    EKG None  Radiology No results found.  Procedures Procedures    Medications Ordered in ED Medications - No data to display  ED Course/ Medical Decision Making/ A&P                                 Medical Decision Making This patient presents to the ED for concern of palpitations, this involves an extensive number of treatment options, and is a complaint that carries with it a high risk of complications and morbidity.  The differential diagnosis includes atrial fibrillation, atrial flutter, other arrhythmia, ACS, PE.   Co morbidities that complicate the patient evaluation  H/o afib on metoprolol , h/o bilateral Pe's on Eliquis , HTN, HLD, T2DM   Additional history obtained:  Additional history obtained from record review External records from outside source obtained and reviewed including recent PCP note   Lab Tests:  I Ordered, and personally interpreted labs.  The pertinent results include: CBC appears stable as compared to previous.  BMP unremarkable.  Troponin within normal limits, low suspicion for ACS at  this time I will discontinue second troponin.  D-dimer negative.   Cardiac Monitoring: / EKG:  The patient was maintained on a cardiac monitor.  I personally viewed and interpreted the cardiac monitored which showed an underlying rhythm of: normal sinus rhythm, occasional PVCs    Problem List / ED Course / Critical interventions / Medication management I have reviewed the patients home medicines and have made adjustments as needed   Social Determinants of Health:  Former tobacco use    Test / Admission - Considered:  Physical exam  is largely reassuring, normal S1/S2, regular rate and rhythm.  Workup is reassuring as above, EKG was found to be in normal sinus rhythm with occasional PVCs.  Negative D-dimer, I did not feel that additional imaging was warranted at this time.  Patient was maintained on cardiac monitor, I did not note any atrial fibrillation or other arrhythmia while patient was monitored, however she does have occasional PVCs and this may explain her symptoms.  I discussed this in depth with patient, advised her to follow-up with her PCP/cardiologist for further workup of her symptoms, she is in agreement with this plan.  Return precautions discussed.  Patient is appropriate for discharge at this time.  Amount and/or Complexity of Data Reviewed Labs: ordered.           Final Clinical Impression(s) / ED Diagnoses Final diagnoses:  Palpitations    Rx / DC Orders ED Discharge Orders     None         Kendrick Pax, PA-C 05/03/24 1958    Sallyanne Creamer, DO 05/06/24 2235

## 2024-05-27 DIAGNOSIS — M1711 Unilateral primary osteoarthritis, right knee: Secondary | ICD-10-CM | POA: Diagnosis not present

## 2024-06-02 DIAGNOSIS — R059 Cough, unspecified: Secondary | ICD-10-CM | POA: Diagnosis not present

## 2024-06-02 DIAGNOSIS — R0981 Nasal congestion: Secondary | ICD-10-CM | POA: Diagnosis not present

## 2024-06-02 DIAGNOSIS — R070 Pain in throat: Secondary | ICD-10-CM | POA: Diagnosis not present

## 2024-06-03 DIAGNOSIS — M1711 Unilateral primary osteoarthritis, right knee: Secondary | ICD-10-CM | POA: Diagnosis not present

## 2024-06-10 DIAGNOSIS — M1711 Unilateral primary osteoarthritis, right knee: Secondary | ICD-10-CM | POA: Diagnosis not present

## 2024-08-06 ENCOUNTER — Encounter: Payer: Self-pay | Admitting: Cardiology

## 2024-08-13 DIAGNOSIS — H524 Presbyopia: Secondary | ICD-10-CM | POA: Diagnosis not present

## 2024-08-13 DIAGNOSIS — H01001 Unspecified blepharitis right upper eyelid: Secondary | ICD-10-CM | POA: Diagnosis not present

## 2024-08-13 DIAGNOSIS — H40013 Open angle with borderline findings, low risk, bilateral: Secondary | ICD-10-CM | POA: Diagnosis not present

## 2024-08-13 DIAGNOSIS — H25813 Combined forms of age-related cataract, bilateral: Secondary | ICD-10-CM | POA: Diagnosis not present

## 2024-08-13 DIAGNOSIS — E119 Type 2 diabetes mellitus without complications: Secondary | ICD-10-CM | POA: Diagnosis not present

## 2024-08-22 DIAGNOSIS — H9203 Otalgia, bilateral: Secondary | ICD-10-CM | POA: Diagnosis not present

## 2024-08-22 DIAGNOSIS — R070 Pain in throat: Secondary | ICD-10-CM | POA: Diagnosis not present

## 2024-08-22 DIAGNOSIS — R519 Headache, unspecified: Secondary | ICD-10-CM | POA: Diagnosis not present

## 2024-08-26 NOTE — Patient Instructions (Signed)

## 2024-08-26 NOTE — Progress Notes (Signed)
 PATIENT: JADWIGA FAIDLEY DOB: 1961/04/21  REASON FOR VISIT: follow up HISTORY FROM: patient  Virtual Visit via Mychart Video Note  I connected with Dagoberto JAYSON Lites on 08/31/24 at  8:15 AM EDT by Mychart and verified that I am speaking with the correct person using two identifiers.   I discussed the limitations, risks, security and privacy concerns of performing an evaluation and management service by Mychart video and the availability of in person appointments. I also discussed with the patient that there may be a patient responsible charge related to this service. The patient expressed understanding and agreed to proceed.   History of Present Illness:  08/31/2024 ALL (Mychart): Geniva returns for follow up for OSA on CPAP. She continues to do well on CPAP therapy. She is using therapy nightly for about 8 hours, on average. She denies concerns with machine or supplies. She is sleeping well. She has recently retired.     08/25/2023 ALL (Mychart): [facility] returns for follow up for OSA on CPAP. She continues to do well on therapy. She is using CPAP nightly for about 6-7 hours, on average. She denies concerns with machine or supplies. She does feel benefit of using therapy.     08/20/22 ALL (Mychart):  Selma Mink is a 63 y.o. female here today for follow up for OSA on CPAP. She was seen in consult with Dr Chalice 03/2022 for a new CPAP machine. PSG showed mild OSA with total AHI of 18.8/h. AutoPAP was ordered. She reports doing well. She is adjusting to her new machine. She denies concerns with machine or supplies.     HISTORY: (copied from Dr Deedra previous note)  SHLEY DOLBY is a 63 y.o. female patient who had been last seen here in 2016 (!) and received her CPAP machine in June 2014- seen here upon a referral on 04/01/2022 from Dr. Janey, MD, PhD.  for a new consultation and new CPAP. SABRA  Chief concern according to patient :   my machine is displaying a message that it needs to be  replaced.   Mrs. Christian has used the same machine now for 9 years set up was Mar 29, 2013.SABRA  She is meanwhile 63 years old she is still 100% compliant user of her CPAP by days and hours with an average use at time of 7 hours 30 minutes at night.  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  Her residual AHI is green at 0.515/h and she has very little air leakage 95th percentile air leak was 2.1 L a minute.   Dagoberto JAYSON Lites  has a past medical history of Acid reflux, Atrial fibrillation (HCC), Chest pain, GERD, Diabetes mellitus, Elevated cholesterol, Fibroid, Hydrosalpinx, Hypertension, Migraine, Ovarian cyst, and OSA on CPAP ( 2014) Sleep apnea with use of continuous positive airway pressure (CPAP). In 1/ 2015 admitted for PE- and unproved deep vein origin, doppler were normal.  ED report in epic.    The patient had the first sleep study in the year 2014 at [facility]   with a result of an AHI ( Apnea Hypopnea index)  of 59.8/h .      Sleep apnea with use of continuous positive airway pressure (CPAP)           02-19-13 , AHI 59.8 , titration to 10 cm water.     SPLIT night titration was to 10  cm water pressure , using an nasal mask.   Sleep relevant medical history: Nocturia every 2 hours 3-4 times each night - while on CPAP,,    Family medical /sleep history: no other family member on CPAP with OSA, insomnia, sleep walkers.    Social history:  Patient is working as Nurse, adult for [employer],  office based- and lives in a household with spouse , ady ult children, 2 biological and one step son.  The patient currently works daytime. Pets are not present. Tobacco use- none since 1996   ETOH use; none ,  Caffeine intake in form of Coffee( 2 cups in AM ) .   Sleep habits are as follows: The patient's dinner time is between 6-7 PM. The patient goes to bed at 9 PM and continues to sleep for 2 hour intervals, 3 times   wakes for  bathroom breaks, the first time at 12 AM.   The preferred sleep position is supine, with the support of 1 pillow , on a recliner  Dreams are reportedly rare.  4  AM is the usual rise time. The patient wakes up spontaneously. Work starts at 7 AM.  She reports not feeling refreshed or restored in AM, with symptoms such as dry mouth ( rarely ) , but no morning headaches, and residual fatigue.  Naps are taken infrequently.    Observations/Objective:  Generalized: Well developed, in no acute distress  Mentation: Alert oriented to time, place, history taking. Follows all commands speech and language fluent   Assessment and Plan:  63 y.o. year old female  has a past medical history of Acid reflux, Atrial fibrillation (HCC), Chest pain, Diabetes mellitus, Elevated cholesterol, Fibroid, Hydrosalpinx, Hypertension, Migraine, Ovarian cyst, and Sleep apnea with use of continuous positive airway pressure (CPAP). here with    ICD-10-CM   1. Sleep apnea with use of continuous positive airway pressure (CPAP)  G47.30 For home use only DME continuous positive airway pressure (CPAP)      Ardene is doing very well on therapy. Compliance report shows excellent compliance. She will continue CPAP nightly for at least 4 hours. Healthy lifestyle habits encouraged. She will follow up with me in 1 year.    Orders Placed This Encounter  Procedures   For home use only DME continuous positive airway pressure (CPAP)    Heated Humidity with all supplies as needed    Length of Need:   Lifetime    Patient has OSA or probable OSA:   Yes    Is the patient currently using CPAP in the home:   Yes    Settings:   Other see comments    CPAP supplies needed:   Mask, headgear, cushions, filters, heated tubing and water chamber    No orders of the defined types were placed in this encounter.    Follow Up Instructions:  I discussed the assessment and treatment plan with the patient. The patient was provided  an opportunity to ask questions and all were answered. The patient agreed with the plan and demonstrated an understanding of the instructions.   The patient was advised to call back or seek an in-person evaluation if the symptoms worsen or if the condition fails to improve as anticipated.  I provided 15 minutes of non-face-to-face time during this encounter. Patient located at their place of residence during Mychart visit. Provider is in the office.    Sadia Belfiore, NP

## 2024-08-30 ENCOUNTER — Encounter: Payer: Self-pay | Admitting: *Deleted

## 2024-08-30 NOTE — Progress Notes (Unsigned)
 SABRA

## 2024-08-31 ENCOUNTER — Telehealth (INDEPENDENT_AMBULATORY_CARE_PROVIDER_SITE_OTHER): Payer: BC Managed Care – PPO | Admitting: Family Medicine

## 2024-08-31 ENCOUNTER — Encounter: Payer: Self-pay | Admitting: Family Medicine

## 2024-08-31 DIAGNOSIS — G4730 Sleep apnea, unspecified: Secondary | ICD-10-CM

## 2024-09-01 ENCOUNTER — Encounter: Payer: Self-pay | Admitting: Family Medicine

## 2024-10-13 DIAGNOSIS — B8809 Other acariasis: Secondary | ICD-10-CM | POA: Diagnosis not present

## 2024-10-13 DIAGNOSIS — H01001 Unspecified blepharitis right upper eyelid: Secondary | ICD-10-CM | POA: Diagnosis not present

## 2024-10-13 DIAGNOSIS — H01004 Unspecified blepharitis left upper eyelid: Secondary | ICD-10-CM | POA: Diagnosis not present

## 2025-09-12 ENCOUNTER — Ambulatory Visit: Admitting: Family Medicine
